# Patient Record
Sex: Female | Born: 1975 | Hispanic: Yes | Marital: Married | Smoking: Never smoker
Health system: Southern US, Community
[De-identification: ages and names within clinical notes are randomized; demographics above are authoritative.]

## PROBLEM LIST (undated history)

## (undated) DIAGNOSIS — E7800 Pure hypercholesterolemia, unspecified: Secondary | ICD-10-CM

## (undated) DIAGNOSIS — N83209 Unspecified ovarian cyst, unspecified side: Secondary | ICD-10-CM

## (undated) DIAGNOSIS — B9689 Other specified bacterial agents as the cause of diseases classified elsewhere: Secondary | ICD-10-CM

## (undated) DIAGNOSIS — N760 Acute vaginitis: Secondary | ICD-10-CM

## (undated) HISTORY — DX: Pure hypercholesterolemia, unspecified: E78.00

## (undated) HISTORY — PX: TUBAL LIGATION: SHX77

## (undated) HISTORY — DX: Unspecified ovarian cyst, unspecified side: N83.209

## (undated) HISTORY — DX: Other specified bacterial agents as the cause of diseases classified elsewhere: B96.89

## (undated) HISTORY — DX: Acute vaginitis: N76.0

---

## 2007-10-06 ENCOUNTER — Emergency Department: Payer: Self-pay | Admitting: Emergency Medicine

## 2008-12-10 ENCOUNTER — Inpatient Hospital Stay: Payer: Self-pay | Admitting: Obstetrics and Gynecology

## 2012-02-06 ENCOUNTER — Emergency Department: Payer: Self-pay | Admitting: Emergency Medicine

## 2012-02-06 LAB — URINALYSIS, COMPLETE
Bacteria: NONE SEEN
Bilirubin,UR: NEGATIVE
Glucose,UR: NEGATIVE mg/dL (ref 0–75)
Leukocyte Esterase: NEGATIVE
Ph: 6 (ref 4.5–8.0)
RBC,UR: 19 /HPF (ref 0–5)
Specific Gravity: 1.028 (ref 1.003–1.030)
Squamous Epithelial: 1
WBC UR: <1 /HPF (ref 0–5)

## 2012-02-06 LAB — PREGNANCY, URINE: Pregnancy Test, Urine: POSITIVE m[IU]/mL

## 2012-02-06 LAB — HCG, QUANTITATIVE, PREGNANCY: Beta Hcg, Quant.: 9121 m[IU]/mL — ABNORMAL HIGH

## 2012-10-02 ENCOUNTER — Inpatient Hospital Stay: Payer: Self-pay

## 2012-10-02 LAB — CBC WITH DIFFERENTIAL/PLATELET
Basophil %: 0.2 %
Eosinophil #: 0 10*3/uL (ref 0.0–0.7)
HCT: 29.6 % — ABNORMAL LOW (ref 35.0–47.0)
HGB: 9.3 g/dL — ABNORMAL LOW (ref 12.0–16.0)
Lymphocyte #: 1.9 10*3/uL (ref 1.0–3.6)
Lymphocyte %: 21.8 %
MCHC: 31.6 g/dL — ABNORMAL LOW (ref 32.0–36.0)
Monocyte %: 4 %
Neutrophil #: 6.3 10*3/uL (ref 1.4–6.5)
WBC: 8.6 10*3/uL (ref 3.6–11.0)

## 2012-10-03 LAB — HEMATOCRIT: HCT: 24.4 % — ABNORMAL LOW (ref 35.0–47.0)

## 2012-10-04 LAB — PATHOLOGY REPORT

## 2015-03-17 NOTE — Op Note (Signed)
PATIENT NAME:  Alexis Odonnell, Alexis Odonnell MR#:  [id number] DATE OF BIRTH:  [birth year]  DATE OF PROCEDURE:  10/03/2012  PREOPERATIVE DIAGNOSIS: Postpartum, desires permanent sterilization.   POSTOPERATIVE DIAGNOSIS: Postpartum, desires permanent sterilization.   PROCEDURE: Bilateral tubal ligation using Pomeroy method.   PRIMARY SURGEON: Dorthula Nettles, M.D.   ANESTHESIA:  General.  ESTIMATED BLOOD LOSS: Minimal.   OPERATIVE FLUIDS: 400 mL of crystalloid.   COMPLICATIONS: None.   SPECIMENS REMOVED: Portions of right and left tubes.   INTRAOPERATIVE FINDINGS: Normal tubes bilateral, walked out to the fimbriated ends. There was some adhesion of omentum to the prior hysterotomy incision but this was able to be navigated around without difficulty.   CONDITION FOLLOWING PROCEDURE: Stable.   PROCEDURE IN DETAIL: Risks, benefits, and alternatives of the procedure were discussed with the patient prior to proceeding. In addition we discussed the permanent nature of the procedure. The patient was taken back to the operating room where general anesthesia was administered. She was positioned in the supine position, prepped and draped in the usual sterile fashion. The umbilicus was infiltrated with 4 mL of 1% Sensorcaine. Following infiltration of the umbilicus, a vertical incision was made extending from the base of the umbilicus approximately 2 cm caudally. The subcutaneous tissue was dissected using a hemostat. The fascia was grasped with the hemostat, tented up, and grasped with a second hemostat. The first hemostat was released and regrasped. The fascia was incised using Mayo scissors. The fascial edges were then tagged with a #1 Vicryl in a GU needle. Following this, the peritoneum was entered bluntly. The patient was airplaned to her right  and the left tube was grasped with a Babcock clamp and walked out to the fimbriated end. Once the fimbria had been visualized, the tube was walked back to the mid  isthmic portion and doubly ligated using a 0 chromic wheel. The intervening knuckle of tube was then excised using Metzenbaum scissors. The tube was inspected and noted to be hemostatic. The tube was returned to the abdomen and the patient was airplaned to her left. The right tube was grasped with a Babcock clamp and walked out to the fimbriated end and then walked back to the mid isthmic portion. The tube was ligated in a similar fashion using two ties of 0 chromic on a wheel. The intervening knuckle of tube was excised using Metzenbaum scissors. The tube was inspected and noted to be hemostatic before returning to the abdomen.   Following replacement of the tube, the patient was leveled and the previously used tags on the fascia were tied together. There was a defect in the inferior portion of the fascial incision which was repaired using additional 0 chromic. Following this, the skin was closed using a 4-0 Monocryl and Dermabond was applied above the incision. An additional 10 mL of 1% Sensorcaine was injected in the subcutaneous tissue. Sponge, needle, and instrument counts were correct times two. The patient tolerated the procedure well and was taken to the recovery room in stable condition.     ____________________________ Stoney Bang. Georgianne Fick, MD ams:bjt D: 10/05/2012 12:00:15 ET T: 10/05/2012 12:26:30 ET JOB#: [id number]  cc: Stoney Bang. Georgianne Fick, MD, <Dictator> Conan Bowens Madelon Lips MD ELECTRONICALLY SIGNED 10/13/2012 23:53

## 2015-04-07 NOTE — H&P (Signed)
L&D Evaluation:  History Expanded:   HPI 39 yo [place], presents in active labor with EDD of 10/07/12. Denies SROM or VB. +FM. Pt was 4 cm on admission about 2 hours ago. PNC notable for early entry to care. H/o prior CS x 1 with 3 VBACs    Group B Strep Results Maternal (Result >5wks must be treated as unknown) negative    Maternal HIV Negative    Maternal Syphilis Ab Nonreactive    Patient's Medical History No Chronic Illness    Patient's Surgical History Previous C-Section    Medications Pre Natal Vitamins    Allergies NKDA   ROS:   ROS see HPI   Exam:   Vital Signs stable    General no apparent distress    Mental Status clear    Chest clear    Heart no murmur/gallop/rubs    Abdomen gravid, tender with contractions    Estimated Fetal Weight Average for gestational age    Pelvic no external lesions, 6/80/-2    Mebranes Intact    FHT normal rate with no decels    Ucx regular, q 4-6 min, moderate   Impression:   Impression active labor   Plan:   Comments Admission for delivery Anticipate vaginal delivery   Electronic Signatures: Ander Purpura (CNM)  (Signed 05-Nov-13 11:47)  Authored: L&D Evaluation   Last Updated: 05-Nov-13 11:47 by Ander Purpura (CNM)

## 2017-05-04 ENCOUNTER — Encounter: Payer: Self-pay | Admitting: Advanced Practice Midwife

## 2017-05-05 ENCOUNTER — Ambulatory Visit (INDEPENDENT_AMBULATORY_CARE_PROVIDER_SITE_OTHER): Payer: Self-pay | Admitting: Advanced Practice Midwife

## 2017-05-05 ENCOUNTER — Encounter: Payer: Self-pay | Admitting: Advanced Practice Midwife

## 2017-05-05 VITALS — BP 120/80 | HR 68 | Ht 61.0 in | Wt 131.0 lb

## 2017-05-05 DIAGNOSIS — Z113 Encounter for screening for infections with a predominantly sexual mode of transmission: Secondary | ICD-10-CM

## 2017-05-05 DIAGNOSIS — N760 Acute vaginitis: Secondary | ICD-10-CM

## 2017-05-05 DIAGNOSIS — Z01419 Encounter for gynecological examination (general) (routine) without abnormal findings: Secondary | ICD-10-CM

## 2017-05-05 DIAGNOSIS — Z124 Encounter for screening for malignant neoplasm of cervix: Secondary | ICD-10-CM

## 2017-05-05 MED ORDER — FLUCONAZOLE 150 MG PO TABS: 150.0000 mg | ORAL_TABLET | Freq: Once | ORAL | 1 refills | Status: DC

## 2017-05-05 NOTE — Progress Notes (Signed)
Patient ID: Alexis Odonnell, female   DOB: [birth year], 41 y.o.   MRN: [id number]     Gynecology Annual Exam  PCP: Patient, No Pcp Per  Chief Complaint:  Chief Complaint  Patient presents with  . Gynecologic Exam    History of Present Illness: Patient is a 41 y.o. [id number] presents for annual exam. The patient has complaints today of intermittent yeast infection for the past six months. She has been having itching and burning with thick white discharge.  She also has concerns for STIs due to her husband having an extramarital affair in the last year.  LMP: Patient's last menstrual period was 04/11/2017. Average Interval: regular, 28 days Duration of flow: 4 days Heavy Menses: no Clots: no Intermenstrual Bleeding: no Postcoital Bleeding: no Dysmenorrhea: no   The patient is sexually active. She currently uses bilateral tubal ligation for contraception. She denies dyspareunia.  The patient does perform self breast exams.  There is no notable family history of breast or ovarian cancer in her family.  The patient wears seatbelts: yes.   The patient has regular exercise: yes.    The patient denies current symptoms of depression.    Review of Systems: Review of Systems  Constitutional: Negative.   HENT: Negative.   Eyes: Negative.   Respiratory: Negative.   Cardiovascular: Negative.   Gastrointestinal: Negative.   Genitourinary: Negative.   Musculoskeletal: Negative.   Skin: Negative.   Neurological: Negative.   Endo/Heme/Allergies: Negative.   Psychiatric/Behavioral: Negative.     Past Medical History:  Past Medical History:  Diagnosis Date  . Ovarian cyst     Past Surgical History:  Past Surgical History:  Procedure Laterality Date  . CESAREAN SECTION    . TUBAL LIGATION    . TUBAL LIGATION      Gynecologic History:  Patient's last menstrual period was 04/11/2017. Contraception: bilateral tubal ligation Last Pap: 2012 Results were: normal Last mammogram:  many years ago Results were: normal Obstetric History: [id number]  Family History:  Family History  Problem Relation Age of Onset  . Hypertension Mother   . Diabetes Father   . Hyperlipidemia Brother     Social History:  Social History   Social History  . Marital status: Single    Spouse name: N/A  . Number of children: N/A  . Years of education: N/A   Occupational History  . Not on file.   Social History Main Topics  . Smoking status: Never Smoker  . Smokeless tobacco: Never Used  . Alcohol use No  . Drug use: No  . Sexual activity: Yes    Birth control/ protection: Surgical   Other Topics Concern  . Not on file   Social History Narrative  . No narrative on file    Allergies:  No Known Allergies  Medications: Prior to Admission medications   Not on File    Physical Exam Vitals: Blood pressure 120/80, pulse 68, height 5\' 1"  (1.549 m), weight 131 lb (59.4 kg), last menstrual period 04/11/2017.  General: NAD HEENT: normocephalic, anicteric Thyroid: no enlargement, no palpable nodules Pulmonary: No increased work of breathing, CTAB Cardiovascular: RRR, distal pulses 2+ Breast: Breast symmetrical, no tenderness, no palpable nodules or masses, no skin or nipple retraction present, no nipple discharge.  No axillary or supraclavicular lymphadenopathy. Abdomen: NABS, soft, non-tender, non-distended.  Umbilicus without lesions.  No hepatomegaly, splenomegaly or masses palpable. No evidence of hernia  Genitourinary:  External: Normal external female genitalia.  Normal urethral meatus, normal Bartholin's  and Skene's glands.    Vagina: Normal vaginal mucosa, no evidence of prolapse.    Cervix: Grossly normal in appearance, no bleeding, no CMT  Uterus: Non-enlarged, mobile, normal contour.    Adnexa: ovaries non-enlarged, no adnexal masses  Rectal: deferred  Lymphatic: no evidence of inguinal lymphadenopathy Extremities: no edema, erythema, or tenderness Neurologic:  Grossly intact Psychiatric: mood appropriate, affect full    Assessment: 41 y.o. [id number] Well woman exam with PAP and Nuswab.   Plan: Problem List Items Addressed This Visit    None    Visit Diagnoses    Well woman exam with routine gynecological exam    -  Primary   Relevant Orders   NuSwab Vaginitis Plus (VG+)   Pap IG (Image Guided)   Cervical cancer screening       Relevant Orders   Pap IG (Image Guided)   Screen for sexually transmitted diseases       Relevant Orders   NuSwab Vaginitis Plus (VG+)   Vaginitis and vulvovaginitis       Relevant Orders   NuSwab Vaginitis Plus (VG+)      1) Mammogram - recommend yearly screening mammogram.  Mammogram patient to schedule   2) STI screening was offered and accepted  3) ASCCP guidelines and rational discussed.  Patient opts for every 3 year screening interval  4) Continue healthy lifestyle diet and exercise  5) Routine healthcare maintenance including cholesterol, diabetes screening discussed: declines  6) Return to clinic in 1 year for annual   Rod Can, [place]

## 2017-05-09 LAB — PAP IG (IMAGE GUIDED): PAP Smear Comment: 0

## 2017-05-10 LAB — NUSWAB VAGINITIS PLUS (VG+)
Atopobium vaginae: HIGH Score — AB
Candida albicans, NAA: POSITIVE — AB
Candida glabrata, NAA: NEGATIVE
Chlamydia trachomatis, NAA: NEGATIVE
Neisseria gonorrhoeae, NAA: NEGATIVE
Trich vag by NAA: NEGATIVE

## 2017-05-16 MED ORDER — FLUCONAZOLE 150 MG PO TABS: 150.0000 mg | ORAL_TABLET | Freq: Once | ORAL | 1 refills | Status: DC

## 2017-05-16 NOTE — Addendum Note (Signed)
Addended by: Rod Can on: 05/16/2017 06:17 PM   Modules accepted: Orders

## 2018-10-12 ENCOUNTER — Ambulatory Visit (INDEPENDENT_AMBULATORY_CARE_PROVIDER_SITE_OTHER): Payer: Self-pay | Admitting: Advanced Practice Midwife

## 2018-10-12 ENCOUNTER — Other Ambulatory Visit (HOSPITAL_COMMUNITY)
Admission: RE | Admit: 2018-10-12 | Discharge: 2018-10-12 | Disposition: A | Discharge status: Home / Self Care | Payer: Self-pay | Source: Ambulatory Visit | Attending: Advanced Practice Midwife | Admitting: Advanced Practice Midwife

## 2018-10-12 ENCOUNTER — Encounter: Payer: Self-pay | Admitting: Advanced Practice Midwife

## 2018-10-12 VITALS — BP 118/74 | Wt 134.0 lb

## 2018-10-12 DIAGNOSIS — N898 Other specified noninflammatory disorders of vagina: Secondary | ICD-10-CM

## 2018-10-12 NOTE — Progress Notes (Signed)
   Patient ID: Alexis Odonnell, female   DOB: [birth year], 42 y.o.   MRN: [id number]  Reason for Consult: Vaginitis    Subjective:     HPI:  Alexis Odonnell is a 42 y.o. female has complaint of vaginal itching since her period stopped. She noticed the itching when she was wearing pads. She denies discharge, odor or irritation. She is concerned that she may have a yeast infection. She has no other concerns today. Recommended cotton covered pads to avoid moisture.  Past Medical History:  Diagnosis Date  . Ovarian cyst    Family History  Problem Relation Age of Onset  . Hypertension Mother   . Diabetes Father   . Hyperlipidemia Brother    Past Surgical History:  Procedure Laterality Date  . CESAREAN SECTION    . TUBAL LIGATION    . TUBAL LIGATION      Short Social History:  Social History   Tobacco Use  . Smoking status: Never Smoker  . Smokeless tobacco: Never Used  Substance Use Topics  . Alcohol use: No    No Known Allergies  No current outpatient medications on file.   No current facility-administered medications for this visit.     Review of Systems  Constitutional:  Constitutional negative. HENT: HENT negative.  Eyes: Eyes negative.  Respiratory: Respiratory negative.  Cardiovascular: Cardiovascular negative.  GI: Gastrointestinal negative.  GU:       Vaginal itching  Musculoskeletal: Musculoskeletal negative.  Skin: Skin negative.  Neurological: Neurological negative. Hematologic: Hematologic/lymphatic negative.  Psychiatric: Psychiatric negative.        Objective:  Objective   Vitals:   10/12/18 1504  BP: 118/74  Weight: 134 lb (60.8 kg)   Body mass index is 25.32 kg/m.  Vital Signs: BP 118/74   Wt 134 lb (60.8 kg)   LMP 10/01/2018   BMI 25.32 kg/m  Constitutional: Well nourished, well developed female in no acute distress.  HEENT: normal Skin: Warm and dry.    Respiratory: Normal respiratory effort Psych: Alert and Oriented x3. No  memory deficits. Normal mood and affect.    Pelvic exam:  is not limited by body habitus EGBUS: within normal limits Vagina: within normal limits and with normal mucosa blood, scant discharge Cervix: not evaluated  Data: Wet prep negative for clue cells, whiff, yeast NuSwab pending     Assessment/Plan:     42 yo G6 P5015  Will await results of NuSwab prior to giving medication Vaginitis treatments discussed Use non-allergenic body products, cotton underwear, loose fitting clothing   Rod Can CNM  [facility], [place]

## 2018-10-16 LAB — CERVICOVAGINAL ANCILLARY ONLY
Bacterial vaginitis: POSITIVE — AB
Candida vaginitis: NEGATIVE
Trichomonas: NEGATIVE

## 2018-10-17 ENCOUNTER — Other Ambulatory Visit: Payer: Self-pay | Admitting: Advanced Practice Midwife

## 2018-10-17 DIAGNOSIS — B9689 Other specified bacterial agents as the cause of diseases classified elsewhere: Secondary | ICD-10-CM

## 2018-10-17 DIAGNOSIS — B373 Candidiasis of vulva and vagina: Secondary | ICD-10-CM

## 2018-10-17 DIAGNOSIS — B3731 Acute candidiasis of vulva and vagina: Secondary | ICD-10-CM

## 2018-10-17 DIAGNOSIS — N760 Acute vaginitis: Principal | ICD-10-CM

## 2018-10-17 MED ORDER — FLUCONAZOLE 150 MG PO TABS: 150.0000 mg | ORAL_TABLET | Freq: Once | ORAL | 1 refills | Status: AC

## 2018-10-17 MED ORDER — METRONIDAZOLE 500 MG PO TABS: 500.0000 mg | ORAL_TABLET | Freq: Two times a day (BID) | ORAL | 0 refills | Status: AC

## 2018-10-17 NOTE — Progress Notes (Signed)
Rx Metronidazole sent to treat BV. Also sent Rx Diflucan in case yeast develops.

## 2019-03-27 ENCOUNTER — Other Ambulatory Visit: Payer: Self-pay | Admitting: Obstetrics and Gynecology

## 2019-03-27 MED ORDER — TERCONAZOLE 0.8 % VA CREA: 1.0000 | TOPICAL_CREAM | Freq: Every day | VAGINAL | 0 refills | Status: AC

## 2019-05-24 ENCOUNTER — Other Ambulatory Visit: Payer: Self-pay

## 2019-05-24 ENCOUNTER — Other Ambulatory Visit: Payer: Self-pay | Admitting: Internal Medicine

## 2019-05-24 DIAGNOSIS — Z20822 Contact with and (suspected) exposure to covid-19: Secondary | ICD-10-CM

## 2019-05-30 LAB — NOVEL CORONAVIRUS, NAA: SARS-CoV-2, NAA: DETECTED — AB

## 2019-07-01 ENCOUNTER — Other Ambulatory Visit: Payer: Self-pay

## 2019-07-01 ENCOUNTER — Encounter: Payer: Self-pay | Admitting: Advanced Practice Midwife

## 2019-07-01 ENCOUNTER — Ambulatory Visit (INDEPENDENT_AMBULATORY_CARE_PROVIDER_SITE_OTHER): Payer: No Typology Code available for payment source | Admitting: Advanced Practice Midwife

## 2019-07-01 VITALS — BP 126/84 | Wt 132.0 lb

## 2019-07-01 DIAGNOSIS — B379 Candidiasis, unspecified: Secondary | ICD-10-CM | POA: Diagnosis not present

## 2019-07-01 DIAGNOSIS — N760 Acute vaginitis: Secondary | ICD-10-CM | POA: Diagnosis not present

## 2019-07-01 DIAGNOSIS — B9689 Other specified bacterial agents as the cause of diseases classified elsewhere: Secondary | ICD-10-CM | POA: Diagnosis not present

## 2019-07-01 MED ORDER — METRONIDAZOLE 500 MG PO TABS
500.0000 mg | ORAL_TABLET | Freq: Two times a day (BID) | ORAL | 0 refills | Status: AC
Start: 2019-07-01 — End: 2019-07-08

## 2019-07-01 MED ORDER — FLUCONAZOLE 150 MG PO TABS: 150.0000 mg | ORAL_TABLET | Freq: Once | ORAL | 1 refills | Status: AC

## 2019-07-01 NOTE — Progress Notes (Signed)
   Patient ID: Alexis Odonnell, female   DOB: 11/21/1976, 43 y.o.   MRN: [id number]  Reason for Consult: Vaginitis    Subjective:     HPI:  Alexis Odonnell is a 43 y.o. female being seen for symptoms of vaginitis. For the past 3 weeks she has had burning and itching. Two days ago she also noticed an odor. She denies discharge. A prescription antifungal was sent for her 2 months ago which she says did not help.   She is also wondering when her next PAP smear is due. She is due June 2021. She has no other concerns today.  Past Medical History:  Diagnosis Date  . Ovarian cyst    Family History  Problem Relation Age of Onset  . Hypertension Mother   . Diabetes Father   . Hyperlipidemia Brother    Past Surgical History:  Procedure Laterality Date  . CESAREAN SECTION    . TUBAL LIGATION    . TUBAL LIGATION      Short Social History:  Social History   Tobacco Use  . Smoking status: Never Smoker  . Smokeless tobacco: Never Used  Substance Use Topics  . Alcohol use: No    No Known Allergies  Current Outpatient Medications  Medication Sig Dispense Refill  . fluconazole (DIFLUCAN) 150 MG tablet Take 1 tablet (150 mg total) by mouth once for 1 dose. Can take additional dose three days later if symptoms persist 1 tablet 1  . metroNIDAZOLE (FLAGYL) 500 MG tablet Take 1 tablet (500 mg total) by mouth 2 (two) times daily for 7 days. 14 tablet 0   No current facility-administered medications for this visit.     Review of Systems  Constitutional: Negative.   HENT: Negative.   Eyes: Negative.   Respiratory: Negative.   Cardiovascular: Negative.   Gastrointestinal: Negative.   Genitourinary:       Vaginal burning, itching and odor  Musculoskeletal: Negative.   Skin: Negative.   Neurological: Negative.   Endo/Heme/Allergies: Negative.   Psychiatric/Behavioral: Negative.         Objective:  Objective   Vitals:   07/01/19 1630  BP: 126/84  Weight: 132 lb (59.9 kg)    Body mass index is 24.94 kg/m. Constitutional: Well nourished, well developed female in no acute distress.  HEENT: normal Skin: Warm and dry.  Cardiovascular: Regular rate and rhythm.   Respiratory:  Normal respiratory effort Neuro: DTRs 2+, Cranial nerves grossly intact Psych: Alert and Oriented x3. No memory deficits. Normal mood and affect.  MS: normal gait, normal bilateral lower extremity ROM/strength/stability.  Pelvic exam:  is not limited by body habitus EGBUS: within normal limits Vagina: within normal limits and with normal mucosa, scant normal appearing discharge Cervix: normal appearance   Data: Wet Prep: Ph: 7 Whiff: mild Clue cells present Yeast: negative     Assessment/Plan:     43 yo G6 P9 female with bacterial vaginosis  Rx Metronidazole Rx Diflucan if yeast develops Return to clinic as needed and for annual exam June 2021   [facility] 07/01/2019, 5:07 PM

## 2019-08-13 ENCOUNTER — Telehealth: Payer: Self-pay

## 2019-08-13 NOTE — Telephone Encounter (Signed)
Pt wants to know if she can have the gel for the BV instead of the pills. States she still has all the same symptoms

## 2019-08-14 ENCOUNTER — Other Ambulatory Visit: Payer: Self-pay | Admitting: Advanced Practice Midwife

## 2019-08-14 DIAGNOSIS — B9689 Other specified bacterial agents as the cause of diseases classified elsewhere: Secondary | ICD-10-CM

## 2019-08-14 MED ORDER — METRONIDAZOLE 0.75 % VA GEL: 1.0000 | Freq: Every day | VAGINAL | 1 refills | Status: AC

## 2019-08-14 NOTE — Telephone Encounter (Signed)
Please let her know that I have sent the Rx for the gel.  Thanks

## 2019-08-14 NOTE — Progress Notes (Unsigned)
Rx metrogel sent for symptoms of BV.

## 2019-08-15 NOTE — Telephone Encounter (Signed)
Pt aware.

## 2019-10-30 ENCOUNTER — Encounter: Payer: Self-pay | Admitting: Obstetrics and Gynecology

## 2019-10-30 ENCOUNTER — Other Ambulatory Visit: Payer: Self-pay

## 2019-10-30 ENCOUNTER — Ambulatory Visit (INDEPENDENT_AMBULATORY_CARE_PROVIDER_SITE_OTHER): Payer: No Typology Code available for payment source | Admitting: Obstetrics and Gynecology

## 2019-10-30 VITALS — BP 106/80 | Ht 61.0 in | Wt 137.0 lb

## 2019-10-30 DIAGNOSIS — N760 Acute vaginitis: Secondary | ICD-10-CM

## 2019-10-30 DIAGNOSIS — Z1231 Encounter for screening mammogram for malignant neoplasm of breast: Secondary | ICD-10-CM

## 2019-10-30 DIAGNOSIS — B9689 Other specified bacterial agents as the cause of diseases classified elsewhere: Secondary | ICD-10-CM | POA: Diagnosis not present

## 2019-10-30 LAB — POCT WET PREP WITH KOH
Clue Cells Wet Prep HPF POC: POSITIVE
KOH Prep POC: POSITIVE — AB
Trichomonas, UA: NEGATIVE
Yeast Wet Prep HPF POC: NEGATIVE

## 2019-10-30 MED ORDER — CLINDAMYCIN HCL 300 MG PO CAPS: 300.0000 mg | ORAL_CAPSULE | Freq: Two times a day (BID) | ORAL | 0 refills | Status: AC

## 2019-10-30 NOTE — Patient Instructions (Signed)
I value your feedback and entrusting us with your care. If you get a [facility] patient survey, I would appreciate you taking the time to let us know about your experience today. Thank you! 

## 2019-10-30 NOTE — Progress Notes (Signed)
Patient, No Pcp Per   Chief Complaint  Patient presents with  . Vaginal Discharge    fishy odor, no itchiness or irritation x 2 weeks    HPI:      Ms. Kaylaann Viergutz is a 43 y.o. [id number] who LMP was Patient's last menstrual period was 10/04/2019 (exact date)., presents today for increased d/c with fishy odor, no irritation, for the past 2 wks. Had BV 8/20 and 9/20, treated with flagyl and metrogel with sx relief. Sx have recurred. No urin sx, LBP, new pelvic pain (gets occas RLQ pain but does heavy lifting), fevers. She is sex active, no new partners. No meds to treat.  Not taking probiotics.   There are no active problems to display for this patient.   Past Surgical History:  Procedure Laterality Date  . CESAREAN SECTION    . TUBAL LIGATION    . TUBAL LIGATION      Family History  Problem Relation Age of Onset  . Hypertension Mother   . Diabetes Father   . Hyperlipidemia Brother     Social History   Socioeconomic History  . Marital status: Single    Spouse name: Not on file  . Number of children: Not on file  . Years of education: Not on file  . Highest education level: Not on file  Occupational History  . Not on file  Social Needs  . Financial resource strain: Not on file  . Food insecurity    Worry: Not on file    Inability: Not on file  . Transportation needs    Medical: Not on file    Non-medical: Not on file  Tobacco Use  . Smoking status: Never Smoker  . Smokeless tobacco: Never Used  Substance and Sexual Activity  . Alcohol use: No  . Drug use: No  . Sexual activity: Yes    Birth control/protection: Surgical  Lifestyle  . Physical activity    Days per week: Not on file    Minutes per session: Not on file  . Stress: Not on file  Relationships  . Social Herbalist on phone: Not on file    Gets together: Not on file    Attends religious service: Not on file    Active member of club or organization: Not on file    Attends meetings  of clubs or organizations: Not on file    Relationship status: Not on file  . Intimate partner violence    Fear of current or ex partner: Not on file    Emotionally abused: Not on file    Physically abused: Not on file    Forced sexual activity: Not on file  Other Topics Concern  . Not on file  Social History Narrative  . Not on file    No outpatient medications prior to visit.   No facility-administered medications prior to visit.      ROS:  Review of Systems  Constitutional: Negative for fever.  Gastrointestinal: Negative for blood in stool, constipation, diarrhea, nausea and vomiting.  Genitourinary: Positive for vaginal discharge. Negative for dyspareunia, dysuria, flank pain, frequency, hematuria, urgency, vaginal bleeding and vaginal pain.  Musculoskeletal: Negative for back pain.  Skin: Negative for rash.   BREAST: No symptoms   OBJECTIVE:   Vitals:  BP 106/80   Ht 5\' 1"  (1.549 m)   Wt 137 lb (62.1 kg)   LMP 10/04/2019 (Exact Date)   BMI 25.89 kg/m   Physical Exam  Vitals signs reviewed.  Constitutional:      Appearance: She is well-developed.  Neck:     Musculoskeletal: Normal range of motion.  Pulmonary:     Effort: Pulmonary effort is normal.  Genitourinary:    General: Normal vulva.     Pubic Area: No rash.      Labia:        Right: No rash, tenderness or lesion.        Left: No rash, tenderness or lesion.      Vagina: Vaginal discharge present. No erythema or tenderness.     Cervix: Normal.     Uterus: Normal. Not enlarged and not tender.      Adnexa: Right adnexa normal and left adnexa normal.       Right: No mass or tenderness.         Left: No mass or tenderness.    Musculoskeletal: Normal range of motion.  Skin:    General: Skin is warm and dry.  Neurological:     General: No focal deficit present.     Mental Status: She is alert and oriented to person, place, and time.  Psychiatric:        Mood and Affect: Mood normal.         Behavior: Behavior normal.        Thought Content: Thought content normal.        Judgment: Judgment normal.     Results: Results for orders placed or performed in visit on 10/30/19 (from the past 24 hour(s))  POCT Wet Prep with KOH     Status: Abnormal   Collection Time: 10/30/19  5:01 PM  Result Value Ref Range   Trichomonas, UA Negative    Clue Cells Wet Prep HPF POC pos    Epithelial Wet Prep HPF POC     Yeast Wet Prep HPF POC neg    Bacteria Wet Prep HPF POC     RBC Wet Prep HPF POC     WBC Wet Prep HPF POC     KOH Prep POC Positive (A) Negative     Assessment/Plan: Bacterial vaginosis - Plan: clindamycin (CLEOCIN) 300 MG capsule, POCT Wet Prep with KOH; pos sx/wet prep. Rx clindamycin since recurrent. Add probiotics. F/u prn.   Encounter for screening mammogram for malignant neoplasm of breast - Plan: 3D MAMMOGRAM SCREENING BILATERAL; due for mammo, pt to sched.   Annual past due and pt to sched.   Meds ordered this encounter  Medications  . clindamycin (CLEOCIN) 300 MG capsule    Sig: Take 1 capsule (300 mg total) by mouth 2 (two) times daily for 7 days.    Dispense:  14 capsule    Refill:  0    Order Specific Question:   Supervising Provider    Answer:   Gae Dry [id number]      Return in about 1 month (around 11/30/2019), or if symptoms worsen or fail to improve, for annual.  Semya Klinke B. Keldric Poyer, PA-C 10/30/2019 5:02 PM

## 2019-11-07 ENCOUNTER — Ambulatory Visit
Admission: RE | Admit: 2019-11-07 | Discharge: 2019-11-07 | Disposition: A | Discharge status: Home / Self Care | Payer: No Typology Code available for payment source | Source: Ambulatory Visit | Attending: Obstetrics and Gynecology | Admitting: Obstetrics and Gynecology

## 2019-11-07 DIAGNOSIS — Z1231 Encounter for screening mammogram for malignant neoplasm of breast: Secondary | ICD-10-CM | POA: Insufficient documentation

## 2019-11-12 ENCOUNTER — Other Ambulatory Visit: Payer: Self-pay | Admitting: Obstetrics and Gynecology

## 2019-11-12 DIAGNOSIS — R928 Other abnormal and inconclusive findings on diagnostic imaging of breast: Secondary | ICD-10-CM

## 2019-11-12 DIAGNOSIS — N6320 Unspecified lump in the left breast, unspecified quadrant: Secondary | ICD-10-CM

## 2019-11-12 DIAGNOSIS — N6489 Other specified disorders of breast: Secondary | ICD-10-CM

## 2019-11-26 ENCOUNTER — Ambulatory Visit
Admission: RE | Admit: 2019-11-26 | Discharge: 2019-11-26 | Disposition: A | Discharge status: Home / Self Care | Payer: No Typology Code available for payment source | Source: Ambulatory Visit | Attending: Obstetrics and Gynecology | Admitting: Obstetrics and Gynecology

## 2019-11-26 DIAGNOSIS — N6320 Unspecified lump in the left breast, unspecified quadrant: Secondary | ICD-10-CM | POA: Diagnosis present

## 2019-11-26 DIAGNOSIS — R928 Other abnormal and inconclusive findings on diagnostic imaging of breast: Secondary | ICD-10-CM

## 2019-11-26 DIAGNOSIS — N6489 Other specified disorders of breast: Secondary | ICD-10-CM | POA: Diagnosis present

## 2019-11-27 ENCOUNTER — Encounter: Payer: Self-pay | Admitting: Obstetrics and Gynecology

## 2019-12-05 ENCOUNTER — Other Ambulatory Visit: Payer: Self-pay

## 2019-12-05 ENCOUNTER — Encounter: Payer: Self-pay | Admitting: Obstetrics and Gynecology

## 2019-12-05 ENCOUNTER — Ambulatory Visit (INDEPENDENT_AMBULATORY_CARE_PROVIDER_SITE_OTHER): Payer: No Typology Code available for payment source | Admitting: Obstetrics and Gynecology

## 2019-12-05 VITALS — BP 110/58 | Ht 61.0 in | Wt 129.0 lb

## 2019-12-05 DIAGNOSIS — Z01419 Encounter for gynecological examination (general) (routine) without abnormal findings: Secondary | ICD-10-CM | POA: Diagnosis not present

## 2019-12-05 DIAGNOSIS — N926 Irregular menstruation, unspecified: Secondary | ICD-10-CM

## 2019-12-05 DIAGNOSIS — Z1231 Encounter for screening mammogram for malignant neoplasm of breast: Secondary | ICD-10-CM

## 2019-12-05 DIAGNOSIS — K644 Residual hemorrhoidal skin tags: Secondary | ICD-10-CM | POA: Insufficient documentation

## 2019-12-05 NOTE — Patient Instructions (Signed)
I value your feedback and entrusting us with your care. If you get a [facility] patient survey, I would appreciate you taking the time to let us know about your experience today. Thank you!  As of November 07, 2019, your lab results will be released to your MyChart immediately, before I even have a chance to see them. Please give me time to review them and contact you if there are any abnormalities. Thank you for your patience.  

## 2019-12-05 NOTE — Progress Notes (Signed)
PCP:  Patient, No Pcp Per   Chief Complaint  Patient presents with  . Gynecologic Exam    Missed period     HPI:      Ms. Alexis Odonnell is a 44 y.o. [id number] who LMP was Patient's last menstrual period was 11/02/2019 (exact date)., presents today for her annual examination.  Her menses are regular every 28-30 days, lasting 3 days.  Dysmenorrhea mild, occurring first 1-2 days of flow. She does not have intermenstrual bleeding. Late menses this month. No DUB sx. Not concerned about being pregnant.   Treated for BV 12/20 with clindamycin with sx relief. 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 Not taking probiotics.   Sex activity: single partner, contraception - tubal ligation.  Last Pap: May 05, 2017  Results were: no abnormalities /no HPV DNA done Hx of STDs: none  Has noticed a bulge in her anus, worse after lifting. Can sometimes push it back in. No pain/bleeding.   Last mammogram: November 26, 2019  Results were: normal after addl views--routine follow-up in 12 months There is no FH of breast cancer. There is no FH of ovarian cancer. The patient does do self-breast exams.  Tobacco use: The patient denies current or previous tobacco use. Alcohol use: none No drug use.  Exercise: not active  She does not get adequate calcium and Vitamin D in her diet.   Past Medical History:  Diagnosis Date  . BV (bacterial vaginosis)   . Ovarian cyst      Past Surgical History:  Procedure Laterality Date  . CESAREAN SECTION    . TUBAL LIGATION    . TUBAL LIGATION      Family History  Problem Relation Age of Onset  . Hypertension Mother   . Diabetes Father   . Hyperlipidemia Brother   . Breast cancer Neg Hx   . Ovarian cancer Neg Hx     Social History   Socioeconomic History  . Marital status: Single    Spouse name: Not on file  . Number of children: Not on file  . Years of education: Not on file  . Highest education level: Not on file  Occupational History  . Not on file   Tobacco Use  . Smoking status: Never Smoker  . Smokeless tobacco: Never Used  Substance and Sexual Activity  . Alcohol use: No  . Drug use: No  . Sexual activity: Yes    Birth control/protection: Surgical  Other Topics Concern  . Not on file  Social History Narrative  . Not on file   Social Determinants of Health   Financial Resource Strain:   . Difficulty of Paying Living Expenses: Not on file  Food Insecurity:   . Worried About Charity fundraiser in the Last Year: Not on file  . Ran Out of Food in the Last Year: Not on file  Transportation Needs:   . Lack of Transportation (Medical): Not on file  . Lack of Transportation (Non-Medical): Not on file  Physical Activity:   . Days of Exercise per Week: Not on file  . Minutes of Exercise per Session: Not on file  Stress:   . Feeling of Stress : Not on file  Social Connections:   . Frequency of Communication with Friends and Family: Not on file  . Frequency of Social Gatherings with Friends and Family: Not on file  . Attends Religious Services: Not on file  . Active Member of Clubs or Organizations: Not on file  .  Attends Archivist Meetings: Not on file  . Marital Status: Not on file  Intimate Partner Violence:   . Fear of Current or Ex-Partner: Not on file  . Emotionally Abused: Not on file  . Physically Abused: Not on file  . Sexually Abused: Not on file     Current Outpatient Medications:  .  Ferrous Sulfate (IRON) 28 MG TABS, Take by mouth., Disp: , Rfl:  .  Vitamin D, Ergocalciferol, (DRISDOL) 1.25 MG (50000 UT) CAPS capsule, Take 50,000 Units by mouth once a week., Disp: , Rfl:      ROS:  Review of Systems  Constitutional: Negative for fatigue, fever and unexpected weight change.  Respiratory: Negative for cough, shortness of breath and wheezing.   Cardiovascular: Negative for chest pain, palpitations and leg swelling.  Gastrointestinal: Negative for blood in stool, constipation, diarrhea, nausea  and vomiting.  Endocrine: Negative for cold intolerance, heat intolerance and polyuria.  Genitourinary: Positive for menstrual problem. Negative for dyspareunia, dysuria, flank pain, frequency, genital sores, hematuria, pelvic pain, urgency, vaginal bleeding, vaginal discharge and vaginal pain.  Musculoskeletal: Negative for back pain, joint swelling and myalgias.  Skin: Negative for rash.  Neurological: Negative for dizziness, syncope, light-headedness, numbness and headaches.  Hematological: Negative for adenopathy.  Psychiatric/Behavioral: Negative for agitation, confusion, sleep disturbance and suicidal ideas. The patient is not nervous/anxious.   BREAST: No symptoms   Objective: BP (!) 110/58   Ht 5\' 1"  (1.549 m)   Wt 129 lb (58.5 kg)   LMP 11/02/2019 (Exact Date)   BMI 24.37 kg/m    Physical Exam Constitutional:      Appearance: She is well-developed.  Genitourinary:     Vulva, cervix, uterus, right adnexa and left adnexa normal.     No vulval lesion or tenderness noted.     Vaginal bleeding present.     No vaginal discharge, erythema or tenderness.     No cervical polyp.     Uterus is not enlarged or tender.     No right or left adnexal mass present.     Right adnexa not tender.     Left adnexa not tender.     Genitourinary Comments: PALE PINK D/C VAGINALLY, C/W EARLY MENSES  Rectum:     External hemorrhoid present.  Neck:     Thyroid: No thyromegaly.  Cardiovascular:     Rate and Rhythm: Normal rate and regular rhythm.     Heart sounds: Normal heart sounds. No murmur.  Pulmonary:     Effort: Pulmonary effort is normal.     Breath sounds: Normal breath sounds.  Chest:     Breasts:        Right: No mass, nipple discharge, skin change or tenderness.        Left: No mass, nipple discharge, skin change or tenderness.  Abdominal:     Palpations: Abdomen is soft.     Tenderness: There is no abdominal tenderness. There is no guarding.  Musculoskeletal:         General: Normal range of motion.     Cervical back: Normal range of motion.  Neurological:     General: No focal deficit present.     Mental Status: She is alert and oriented to person, place, and time.     Cranial Nerves: No cranial nerve deficit.  Skin:    General: Skin is warm and dry.  Psychiatric:        Mood and Affect: Mood normal.  Behavior: Behavior normal.        Thought Content: Thought content normal.        Judgment: Judgment normal.  Vitals reviewed.    Assessment/Plan: Encounter for annual routine gynecological examination  Encounter for screening mammogram for malignant neoplasm of breast; pt current on mammo  Late menses--looks like going to start today due to pale pink d/c on exam  External hemorrhoid--no evidence thrombosis, no sx. Reassurance. F/u prn.           GYN counsel mammography screening, adequate intake of calcium and vitamin D, diet and exercise     F/U  Return in about 1 year (around 12/04/2020).  Alicia B. Copland, PA-C 12/05/2019 4:34 PM

## 2020-02-10 ENCOUNTER — Encounter: Payer: Self-pay | Admitting: Obstetrics and Gynecology

## 2020-02-10 ENCOUNTER — Other Ambulatory Visit: Payer: Self-pay | Admitting: Obstetrics and Gynecology

## 2020-02-10 MED ORDER — FLUCONAZOLE 150 MG PO TABS: 150.0000 mg | ORAL_TABLET | Freq: Once | ORAL | 0 refills | Status: AC

## 2020-02-10 NOTE — Progress Notes (Signed)
Rx diflucan for yeast vag sx.  

## 2020-07-10 ENCOUNTER — Other Ambulatory Visit: Payer: Self-pay

## 2020-07-10 ENCOUNTER — Emergency Department: Payer: No Typology Code available for payment source

## 2020-07-10 ENCOUNTER — Emergency Department
Admission: EM | Admit: 2020-07-10 | Discharge: 2020-07-11 | Disposition: A | Discharge status: Home / Self Care | Payer: No Typology Code available for payment source | Attending: Emergency Medicine | Admitting: Emergency Medicine

## 2020-07-10 ENCOUNTER — Encounter: Payer: Self-pay | Admitting: Physician Assistant

## 2020-07-10 DIAGNOSIS — S6392XA Sprain of unspecified part of left wrist and hand, initial encounter: Secondary | ICD-10-CM | POA: Insufficient documentation

## 2020-07-10 DIAGNOSIS — Y9389 Activity, other specified: Secondary | ICD-10-CM | POA: Diagnosis not present

## 2020-07-10 DIAGNOSIS — Y92410 Unspecified street and highway as the place of occurrence of the external cause: Secondary | ICD-10-CM | POA: Diagnosis not present

## 2020-07-10 DIAGNOSIS — S66812A Strain of other specified muscles, fascia and tendons at wrist and hand level, left hand, initial encounter: Secondary | ICD-10-CM | POA: Diagnosis not present

## 2020-07-10 DIAGNOSIS — Z041 Encounter for examination and observation following transport accident: Secondary | ICD-10-CM | POA: Insufficient documentation

## 2020-07-10 DIAGNOSIS — S0990XA Unspecified injury of head, initial encounter: Secondary | ICD-10-CM | POA: Insufficient documentation

## 2020-07-10 DIAGNOSIS — Y999 Unspecified external cause status: Secondary | ICD-10-CM | POA: Diagnosis not present

## 2020-07-10 DIAGNOSIS — S66912A Strain of unspecified muscle, fascia and tendon at wrist and hand level, left hand, initial encounter: Secondary | ICD-10-CM

## 2020-07-10 MED ORDER — BACLOFEN 10 MG PO TABS: 10.0000 mg | ORAL_TABLET | Freq: Three times a day (TID) | ORAL | 1 refills | Status: AC

## 2020-07-10 MED ORDER — MELOXICAM 15 MG PO TABS: 15.0000 mg | ORAL_TABLET | Freq: Every day | ORAL | 2 refills | Status: AC
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
## 2020-07-10 NOTE — Discharge Instructions (Addendum)
Follow-up with your regular doctor if not improving in 5 to 7 days.  Return emergency department worsening.  Continue to have left hand pain please follow-up with orthopedics.  They are listed above.

## 2021-01-01 ENCOUNTER — Other Ambulatory Visit: Payer: No Typology Code available for payment source

## 2021-01-01 ENCOUNTER — Other Ambulatory Visit: Payer: Self-pay

## 2021-01-01 DIAGNOSIS — Z20822 Contact with and (suspected) exposure to covid-19: Secondary | ICD-10-CM

## 2021-01-02 LAB — SARS-COV-2, NAA 2 DAY TAT

## 2021-01-02 LAB — NOVEL CORONAVIRUS, NAA: SARS-CoV-2, NAA: DETECTED — AB

## 2021-01-03 ENCOUNTER — Telehealth: Payer: Self-pay | Admitting: Family

## 2021-01-03 NOTE — Telephone Encounter (Signed)
2nd ATTEMPT. Called to discuss with patient about COVID-19 symptoms and the use of one of the available treatments for those with mild to moderate Covid symptoms and at a high risk of hospitalization.  Pt appears to qualify for outpatient treatment due to co-morbid conditions and/or a member of an at-risk group in accordance with the FDA Emergency Use Authorization.    Symptom onset:  Vaccinated:  Booster?  Immunocompromised?  Qualifiers:  I attempted to contact Alexis Odonnell to gather additional information and was unable to reach her via telephone. A voicemail with contact number was provided. Previous MyChart message that was sent was noted to be read.   Terri Piedra, NP 01/03/2021 1:58 PM

## 2021-08-17 ENCOUNTER — Ambulatory Visit (INDEPENDENT_AMBULATORY_CARE_PROVIDER_SITE_OTHER): Payer: No Typology Code available for payment source | Admitting: Obstetrics and Gynecology

## 2021-08-17 ENCOUNTER — Other Ambulatory Visit: Payer: Self-pay

## 2021-08-17 ENCOUNTER — Encounter: Payer: Self-pay | Admitting: Obstetrics and Gynecology

## 2021-08-17 ENCOUNTER — Other Ambulatory Visit (HOSPITAL_COMMUNITY)
Admission: RE | Admit: 2021-08-17 | Discharge: 2021-08-17 | Disposition: A | Discharge status: Home / Self Care | Payer: PRIVATE HEALTH INSURANCE | Source: Ambulatory Visit | Attending: Obstetrics and Gynecology | Admitting: Obstetrics and Gynecology

## 2021-08-17 VITALS — BP 110/80 | Ht 61.0 in | Wt 139.0 lb

## 2021-08-17 DIAGNOSIS — B9689 Other specified bacterial agents as the cause of diseases classified elsewhere: Secondary | ICD-10-CM | POA: Diagnosis not present

## 2021-08-17 DIAGNOSIS — Z1211 Encounter for screening for malignant neoplasm of colon: Secondary | ICD-10-CM

## 2021-08-17 DIAGNOSIS — Z124 Encounter for screening for malignant neoplasm of cervix: Secondary | ICD-10-CM | POA: Diagnosis not present

## 2021-08-17 DIAGNOSIS — Z1231 Encounter for screening mammogram for malignant neoplasm of breast: Secondary | ICD-10-CM | POA: Diagnosis not present

## 2021-08-17 DIAGNOSIS — Z01419 Encounter for gynecological examination (general) (routine) without abnormal findings: Secondary | ICD-10-CM | POA: Diagnosis not present

## 2021-08-17 DIAGNOSIS — Z1151 Encounter for screening for human papillomavirus (HPV): Secondary | ICD-10-CM

## 2021-08-17 DIAGNOSIS — N760 Acute vaginitis: Secondary | ICD-10-CM

## 2021-08-17 LAB — POCT WET PREP WITH KOH
Clue Cells Wet Prep HPF POC: POSITIVE
KOH Prep POC: POSITIVE — AB
Trichomonas, UA: NEGATIVE
Yeast Wet Prep HPF POC: NEGATIVE

## 2021-08-17 MED ORDER — METRONIDAZOLE 500 MG PO TABS: 500.0000 mg | ORAL_TABLET | Freq: Two times a day (BID) | ORAL | 0 refills | Status: AC

## 2021-08-17 NOTE — Progress Notes (Signed)
PCP:  Patient, No Pcp Per (Inactive)   Chief Complaint  Patient presents with   Gynecologic Exam    No concerns     HPI:      Alexis Odonnell is a 45 y.o. [phone] who LMP was Patient's last menstrual period was 08/13/2021 (exact date)., presents today for her annual examination.  Her menses are regular every 28-30 days, lasting 3 days.  Dysmenorrhea mild, occurring first 1-2 days of flow. She does not have intermenstrual bleeding. No vasomotor sx.   Hx of recurrent BV in past but no sx until recently. Noticed fishy odor before LMP and treated with OTC insert with some relief. Not sure if still has sx since just finished her period. Tx with flagyl and clindamycin in past.   Sex activity: single partner, contraception - tubal ligation. No pain/bleeding. Last Pap: May 05, 2017  Results were: no abnormalities /no HPV DNA done Hx of STDs: none  Last mammogram: November 26, 2019  Results were: normal after addl views--routine follow-up in 12 months There is no FH of breast cancer. There is no FH of ovarian cancer. The patient does not do self-breast exams. Gets breast tenderness before menses.   Tobacco use: The patient denies current or previous tobacco use. Alcohol use: none No drug use.  Exercise: not active  Colonoscopy: never  She does get adequate calcium but not Vitamin D in her diet.   Past Medical History:  Diagnosis Date   BV (bacterial vaginosis)    Ovarian cyst      Past Surgical History:  Procedure Laterality Date   CESAREAN SECTION     TUBAL LIGATION     TUBAL LIGATION      Family History  Problem Relation Age of Onset   Hypertension Mother    Diabetes Father    Hyperlipidemia Brother    Breast cancer Neg Hx    Ovarian cancer Neg Hx     Social History   Socioeconomic History   Marital status: Married    Spouse name: Not on file   Number of children: Not on file   Years of education: Not on file   Highest education level: Not on file   Occupational History   Not on file  Tobacco Use   Smoking status: Never   Smokeless tobacco: Never  Vaping Use   Vaping Use: Never used  Substance and Sexual Activity   Alcohol use: No   Drug use: No   Sexual activity: Yes    Birth control/protection: Surgical    Comment: Tubal Ligation  Other Topics Concern   Not on file  Social History Narrative   Not on file   Social Determinants of Health   Financial Resource Strain: Not on file  Food Insecurity: Not on file  Transportation Needs: Not on file  Physical Activity: Not on file  Stress: Not on file  Social Connections: Not on file  Intimate Partner Violence: Not on file     Current Outpatient Medications:    metroNIDAZOLE (FLAGYL) 500 MG tablet, Take 1 tablet (500 mg total) by mouth 2 (two) times daily for 7 days., Disp: 14 tablet, Rfl: 0     ROS:  Review of Systems  Constitutional:  Negative for fatigue, fever and unexpected weight change.  Respiratory:  Negative for cough, shortness of breath and wheezing.   Cardiovascular:  Negative for chest pain, palpitations and leg swelling.  Gastrointestinal:  Negative for blood in stool, constipation, diarrhea, nausea and vomiting.  Endocrine: Negative for cold intolerance, heat intolerance and polyuria.  Genitourinary:  Positive for vaginal discharge. Negative for dyspareunia, dysuria, flank pain, frequency, genital sores, hematuria, menstrual problem, pelvic pain, urgency, vaginal bleeding and vaginal pain.  Musculoskeletal:  Negative for back pain, joint swelling and myalgias.  Skin:  Negative for rash.  Neurological:  Negative for dizziness, syncope, light-headedness, numbness and headaches.  Hematological:  Negative for adenopathy.  Psychiatric/Behavioral:  Negative for agitation, confusion, sleep disturbance and suicidal ideas. The patient is not nervous/anxious.  BREAST: No symptoms   Objective: BP 110/80   Ht 5\' 1"  (1.549 m)   Wt 139 lb (63 kg)   LMP  08/13/2021 (Exact Date)   BMI 26.26 kg/m    Physical Exam Constitutional:      Appearance: She is well-developed.  Genitourinary:     Vulva normal.     Right Labia: No rash, tenderness or lesions.    Left Labia: No tenderness, lesions or rash.    No vaginal discharge, erythema or tenderness.      Right Adnexa: not tender and no mass present.    Left Adnexa: not tender and no mass present.    No cervical friability or polyp.     Uterus is not enlarged or tender.  Breasts:    Right: No mass, nipple discharge, skin change or tenderness.     Left: No mass, nipple discharge, skin change or tenderness.  Neck:     Thyroid: No thyromegaly.  Cardiovascular:     Rate and Rhythm: Normal rate and regular rhythm.     Heart sounds: Normal heart sounds. No murmur heard. Pulmonary:     Effort: Pulmonary effort is normal.     Breath sounds: Normal breath sounds.  Abdominal:     Palpations: Abdomen is soft.     Tenderness: There is no abdominal tenderness. There is no guarding or rebound.  Musculoskeletal:        General: Normal range of motion.     Cervical back: Normal range of motion.  Lymphadenopathy:     Cervical: No cervical adenopathy.  Neurological:     General: No focal deficit present.     Mental Status: She is alert and oriented to person, place, and time.     Cranial Nerves: No cranial nerve deficit.  Skin:    General: Skin is warm and dry.  Psychiatric:        Mood and Affect: Mood normal.        Behavior: Behavior normal.        Thought Content: Thought content normal.        Judgment: Judgment normal.  Vitals reviewed.   Results for orders placed or performed in visit on 08/17/21 (from the past 24 hour(s))  POCT Wet Prep with KOH     Status: Abnormal   Collection Time: 08/17/21  5:04 PM  Result Value Ref Range   Trichomonas, UA Negative    Clue Cells Wet Prep HPF POC pos    Epithelial Wet Prep HPF POC     Yeast Wet Prep HPF POC neg    Bacteria Wet Prep HPF POC      RBC Wet Prep HPF POC     WBC Wet Prep HPF POC     KOH Prep POC Positive (A) Negative     Assessment/Plan: Encounter for annual routine gynecological examination  Cervical cancer screening - Plan: Cytology - PAP  Screening for HPV (human papillomavirus) - Plan: Cytology - PAP  Encounter  for screening mammogram for malignant neoplasm of breast - Plan: MM 3D SCREEN BREAST BILATERAL; pt to sched mammo  Screening for colon cancer - Plan: Ambulatory referral to Gastroenterology; refer to GI for colonoscopy due to age  BV (bacterial vaginosis) - Plan: metroNIDAZOLE (FLAGYL) 500 MG tablet, POCT Wet Prep with KOH; pos sx and wet prep. Rx flagyl, no EtOH. Will RF if sx recur. F/u prn.   Meds ordered this encounter  Medications   metroNIDAZOLE (FLAGYL) 500 MG tablet    Sig: Take 1 tablet (500 mg total) by mouth 2 (two) times daily for 7 days.    Dispense:  14 tablet    Refill:  0    Order Specific Question:   Supervising Provider    Answer:   Gae Dry [[id number]]           GYN counsel mammography screening, adequate intake of calcium and vitamin D, diet and exercise     F/U  Return in about 1 year (around 08/17/2022).  Orvan Papadakis B. Shaterria Sager, PA-C 08/17/2021 5:04 PM

## 2021-08-17 NOTE — Patient Instructions (Addendum)
I value your feedback and you entrusting us with your care. If you get a [place] patient survey, I would appreciate you taking the time to let us know about your experience today. Thank you!  [facility]: [phone]  [facility]: [phone]    

## 2021-08-19 ENCOUNTER — Other Ambulatory Visit: Payer: Self-pay

## 2021-08-19 DIAGNOSIS — Z1211 Encounter for screening for malignant neoplasm of colon: Secondary | ICD-10-CM

## 2021-08-19 LAB — CYTOLOGY - PAP
Comment: NEGATIVE
Diagnosis: NEGATIVE
High risk HPV: NEGATIVE

## 2021-08-19 MED ORDER — NA SULFATE-K SULFATE-MG SULF 17.5-3.13-1.6 GM/177ML PO SOLN: 1.0000 | Freq: Once | ORAL | 0 refills | Status: AC

## 2021-08-19 NOTE — Progress Notes (Signed)
Gastroenterology Pre-Procedure Review  Request Date: 09/08/21 Requesting Physician: Dr. Vicente Males  PATIENT REVIEW QUESTIONS: The patient responded to the following health history questions as indicated:    1. Are you having any GI issues? no 2. Do you have a personal history of Polyps? no 3. Do you have a family history of Colon Cancer or Polyps? no 4. Diabetes Mellitus? no 5. Joint replacements in the past 12 months?no 6. Major health problems in the past 3 months?no 7. Any artificial heart valves, MVP, or defibrillator?no    MEDICATIONS & ALLERGIES:    Patient reports the following regarding taking any anticoagulation/antiplatelet therapy:   Plavix, Coumadin, Eliquis, Xarelto, Lovenox, Pradaxa, Brilinta, or Effient? no Aspirin? no  Patient confirms/reports the following medications:  Current Outpatient Medications  Medication Sig Dispense Refill   metroNIDAZOLE (FLAGYL) 500 MG tablet Take 1 tablet (500 mg total) by mouth 2 (two) times daily for 7 days. 14 tablet 0   No current facility-administered medications for this visit.    Patient confirms/reports the following allergies:  No Known Allergies  No orders of the defined types were placed in this encounter.   AUTHORIZATION INFORMATION Primary Insurance: 1D#: Group #:  Secondary Insurance: 1D#: Group #:  SCHEDULE INFORMATION: Date: 09/08/21  Time: Location: [facility]

## 2021-08-30 ENCOUNTER — Other Ambulatory Visit: Payer: Self-pay | Admitting: Internal Medicine

## 2021-09-01 ENCOUNTER — Other Ambulatory Visit: Payer: Self-pay

## 2021-09-01 ENCOUNTER — Ambulatory Visit
Admission: RE | Admit: 2021-09-01 | Discharge: 2021-09-01 | Disposition: A | Discharge status: Home / Self Care | Payer: No Typology Code available for payment source | Source: Ambulatory Visit | Attending: Obstetrics and Gynecology | Admitting: Obstetrics and Gynecology

## 2021-09-01 DIAGNOSIS — Z1231 Encounter for screening mammogram for malignant neoplasm of breast: Secondary | ICD-10-CM | POA: Insufficient documentation

## 2021-09-08 ENCOUNTER — Ambulatory Visit: Payer: PRIVATE HEALTH INSURANCE | Admitting: Certified Registered Nurse Anesthetist

## 2021-09-08 ENCOUNTER — Ambulatory Visit
Admission: RE | Admit: 2021-09-08 | Discharge: 2021-09-08 | Disposition: A | Discharge status: Home / Self Care | Payer: PRIVATE HEALTH INSURANCE | Attending: Gastroenterology | Admitting: Gastroenterology

## 2021-09-08 ENCOUNTER — Encounter: Payer: Self-pay | Admitting: Gastroenterology

## 2021-09-08 ENCOUNTER — Encounter
Admission: RE | Disposition: A | Discharge status: Home / Self Care | Payer: Self-pay | Source: Home / Self Care | Attending: Gastroenterology

## 2021-09-08 DIAGNOSIS — D126 Benign neoplasm of colon, unspecified: Secondary | ICD-10-CM | POA: Diagnosis not present

## 2021-09-08 DIAGNOSIS — Z79890 Hormone replacement therapy (postmenopausal): Secondary | ICD-10-CM | POA: Diagnosis not present

## 2021-09-08 DIAGNOSIS — K635 Polyp of colon: Secondary | ICD-10-CM | POA: Diagnosis not present

## 2021-09-08 DIAGNOSIS — Z1211 Encounter for screening for malignant neoplasm of colon: Secondary | ICD-10-CM | POA: Insufficient documentation

## 2021-09-08 HISTORY — PX: COLONOSCOPY WITH PROPOFOL: SHX5780

## 2021-09-08 LAB — POCT PREGNANCY, URINE: Preg Test, Ur: NEGATIVE

## 2021-09-08 SURGERY — COLONOSCOPY WITH PROPOFOL
Anesthesia: General

## 2021-09-08 MED ORDER — PROPOFOL 10 MG/ML IV BOLUS
INTRAVENOUS | Status: DC | PRN
  Administered 2021-09-08: 70 mg via INTRAVENOUS
  Administered 2021-09-08: 30 mg via INTRAVENOUS

## 2021-09-08 MED ORDER — GLYCOPYRROLATE 0.2 MG/ML IJ SOLN
INTRAMUSCULAR | Status: DC | PRN
  Administered 2021-09-08: .2 mg via INTRAVENOUS

## 2021-09-08 MED ORDER — LIDOCAINE HCL (CARDIAC) PF 100 MG/5ML IV SOSY
PREFILLED_SYRINGE | INTRAVENOUS | Status: DC | PRN
  Administered 2021-09-08: 50 mg via INTRAVENOUS

## 2021-09-08 MED ORDER — PHENYLEPHRINE HCL (PRESSORS) 10 MG/ML IV SOLN
INTRAVENOUS | Status: DC | PRN
  Administered 2021-09-08: 80 ug via INTRAVENOUS

## 2021-09-08 MED ORDER — SODIUM CHLORIDE 0.9 % IV SOLN
INTRAVENOUS | Status: DC
  Administered 2021-09-08: 1000 mL via INTRAVENOUS

## 2021-09-08 MED ORDER — PROPOFOL 500 MG/50ML IV EMUL
INTRAVENOUS | Status: DC | PRN
  Administered 2021-09-08: 160 ug/kg/min via INTRAVENOUS

## 2021-09-08 MED ORDER — DEXMEDETOMIDINE (PRECEDEX) IN NS 20 MCG/5ML (4 MCG/ML) IV SYRINGE
PREFILLED_SYRINGE | INTRAVENOUS | Status: DC | PRN
  Administered 2021-09-08: 8 ug via INTRAVENOUS

## 2021-09-08 NOTE — Op Note (Signed)
[facility] Patient Name: Alexis Odonnell Procedure Date: 09/08/2021 1:47 PM MRN: [id number] Account #: [id number] Date of Birth: 1976-11-24 Admit Type: Outpatient Age: 45 Room: [facility] ENDO ROOM 3 Gender: Female Note Status: Finalized Instrument Name: Jasper Riling [id number] Procedure:             Colonoscopy Indications:           Screening for colorectal malignant neoplasm Providers:             Jonathon Bellows MD, MD Medicines:             Monitored Anesthesia Care Complications:         No immediate complications. Procedure:             Pre-Anesthesia Assessment:                        - Prior to the procedure, a History and Physical was                         performed, and patient medications, allergies and                         sensitivities were reviewed. The patient's tolerance                         of previous anesthesia was reviewed.                        - The risks and benefits of the procedure and the                         sedation options and risks were discussed with the                         patient. All questions were answered and informed                         consent was obtained.                        - ASA Grade Assessment: II - A patient with mild                         systemic disease.                        After obtaining informed consent, the colonoscope was                         passed under direct vision. Throughout the procedure,                         the patient's blood pressure, pulse, and oxygen                         saturations were monitored continuously. The                         Colonoscope was introduced through the anus and  advanced to the the cecum, identified by the                         appendiceal orifice. The colonoscopy was performed                         with ease. The patient tolerated the procedure well.                         The quality of the bowel  preparation was excellent. Findings:      The perianal and digital rectal examinations were normal.      A 3 mm polyp was found in the transverse colon. The polyp was sessile.       The polyp was removed with a cold biopsy forceps. Resection and       retrieval were complete.      The exam was otherwise without abnormality on direct and retroflexion       views. Impression:            - One 3 mm polyp in the transverse colon, removed with                         a cold biopsy forceps. Resected and retrieved.                        - The examination was otherwise normal on direct and                         retroflexion views. Recommendation:        - Discharge patient to home (with escort).                        - Resume previous diet.                        - Continue present medications.                        - Await pathology results.                        - Repeat colonoscopy for surveillance based on                         pathology results. Procedure Code(s):     --- Professional ---                        [phone], Colonoscopy, flexible; with biopsy, single or                         multiple Diagnosis Code(s):     --- Professional ---                        Z12.11, Encounter for screening for malignant neoplasm                         of colon  K63.5, Polyp of colon CPT copyright 2019 American Medical Association. All rights reserved. The codes documented in this report are preliminary and upon coder review may  be revised to meet current compliance requirements. Jonathon Bellows, MD Jonathon Bellows MD, MD 09/08/2021 2:03:51 PM This report has been signed electronically. Number of Addenda: 0 Note Initiated On: 09/08/2021 1:47 PM Scope Withdrawal Time: 0 hours 7 minutes 6 seconds  Total Procedure Duration: 0 hours 9 minutes 30 seconds  Estimated Blood Loss:  Estimated blood loss: none.      [facility]

## 2021-09-08 NOTE — H&P (Signed)
     Jonathon Bellows, MD [address], [place], [place], [place], [address] [address], [place], [place], [place], [address] Phone: [phone]  Fax: [phone]  Primary Care Physician:  Chad Cordial, PA-C   Pre-Procedure History & Physical: HPI:  Adela Esteban is a 45 y.o. female is here for an colonoscopy.   Past Medical History:  Diagnosis Date   BV (bacterial vaginosis)    Ovarian cyst     Past Surgical History:  Procedure Laterality Date   CESAREAN SECTION     TUBAL LIGATION     TUBAL LIGATION      Prior to Admission medications   Medication Sig Start Date End Date Taking? Authorizing Provider  levothyroxine (SYNTHROID) 150 MCG tablet TAKE 1 TABLET BY MOUTH EVERY DAY--MAKE APPT FOR FURTHER REFILLS 08/30/21   Cletis Athens, MD    Allergies as of 08/19/2021   (No Known Allergies)    Family History  Problem Relation Age of Onset   Hypertension Mother    Diabetes Father    Hyperlipidemia Brother    Breast cancer Neg Hx    Ovarian cancer Neg Hx     Social History   Socioeconomic History   Marital status: Married    Spouse name: Not on file   Number of children: Not on file   Years of education: Not on file   Highest education level: Not on file  Occupational History   Not on file  Tobacco Use   Smoking status: Never   Smokeless tobacco: Never  Vaping Use   Vaping Use: Never used  Substance and Sexual Activity   Alcohol use: No   Drug use: No   Sexual activity: Yes    Birth control/protection: Surgical    Comment: Tubal Ligation  Other Topics Concern   Not on file  Social History Narrative   Not on file   Social Determinants of Health   Financial Resource Strain: Not on file  Food Insecurity: Not on file  Transportation Needs: Not on file  Physical Activity: Not on file  Stress: Not on file  Social Connections: Not on file  Intimate Partner Violence: Not on file    Review of Systems: See HPI, otherwise negative ROS  Physical  Exam: BP 120/71   Temp (!) 96.1 F (35.6 C)   Resp 18   Ht 5\' 1"  (1.549 m)   Wt 64.4 kg   LMP 09/08/2021 (Exact Date)   SpO2 100%   BMI 26.83 kg/m  General:   Alert,  pleasant and cooperative in NAD Head:  Normocephalic and atraumatic. Neck:  Supple; no masses or thyromegaly. Lungs:  Clear throughout to auscultation, normal respiratory effort.    Heart:  +S1, +S2, Regular rate and rhythm, No edema. Abdomen:  Soft, nontender and nondistended. Normal bowel sounds, without guarding, and without rebound.   Neurologic:  Alert and  oriented x4;  grossly normal neurologically.  Impression/Plan: Brandalyn Harting is here for an colonoscopy to be performed for Screening colonoscopy average risk   Risks, benefits, limitations, and alternatives regarding  colonoscopy have been reviewed with the patient.  Questions have been answered.  All parties agreeable.   Jonathon Bellows, MD  09/08/2021, 1:42 PM

## 2021-09-08 NOTE — Anesthesia Preprocedure Evaluation (Signed)
Anesthesia Evaluation  Patient identified by MRN, date of birth, ID band Patient awake    Reviewed: Allergy & Precautions, NPO status , Patient's Chart, lab work & pertinent test results  History of Anesthesia Complications Negative for: history of anesthetic complications  Airway Mallampati: III   Neck ROM: Full    Dental no notable dental hx.    Pulmonary neg pulmonary ROS,    Pulmonary exam normal breath sounds clear to auscultation       Cardiovascular Exercise Tolerance: Good negative cardio ROS Normal cardiovascular exam Rhythm:Regular Rate:Normal     Neuro/Psych negative neurological ROS     GI/Hepatic negative GI ROS,   Endo/Other  Hypothyroidism   Renal/GU negative Renal ROS     Musculoskeletal   Abdominal   Peds  Hematology negative hematology ROS (+)   Anesthesia Other Findings   Reproductive/Obstetrics                             Anesthesia Physical Anesthesia Plan  ASA: 2  Anesthesia Plan: General   Post-op Pain Management:    Induction: Intravenous  PONV Risk Score and Plan: 3 and Propofol infusion, TIVA and Treatment may vary due to age or medical condition  Airway Management Planned: Natural Airway  Additional Equipment:   Intra-op Plan:   Post-operative Plan:   Informed Consent: I have reviewed the patients History and Physical, chart, labs and discussed the procedure including the risks, benefits and alternatives for the proposed anesthesia with the patient or authorized representative who has indicated his/her understanding and acceptance.       Plan Discussed with: CRNA  Anesthesia Plan Comments:         Anesthesia Quick Evaluation

## 2021-09-08 NOTE — Anesthesia Postprocedure Evaluation (Signed)
Anesthesia Post Note  Patient: Alexis Odonnell  Procedure(s) Performed: COLONOSCOPY WITH PROPOFOL  Patient location during evaluation: PACU Anesthesia Type: General Level of consciousness: awake and alert, oriented and patient cooperative Pain management: pain level controlled Vital Signs Assessment: post-procedure vital signs reviewed and stable Respiratory status: spontaneous breathing, nonlabored ventilation and respiratory function stable Cardiovascular status: blood pressure returned to baseline and stable Postop Assessment: adequate PO intake Anesthetic complications: no   No notable events documented.   Last Vitals:  Vitals:   09/08/21 1415 09/08/21 1425  BP: (!) 91/59 112/71  Pulse: 70 73  Resp: 15 17  Temp:    SpO2: 99% 100%    Last Pain:  Vitals:   09/08/21 1425  TempSrc:   PainSc: 0-No pain                 Darrin Nipper

## 2021-09-08 NOTE — Transfer of Care (Signed)
Immediate Anesthesia Transfer of Care Note  Patient: Alexis Odonnell  Procedure(s) Performed: COLONOSCOPY WITH PROPOFOL  Patient Location: PACU  Anesthesia Type:General  Level of Consciousness: drowsy  Airway & Oxygen Therapy: Patient Spontanous Breathing  Post-op Assessment: Report given to RN and Post -op Vital signs reviewed and stable  Post vital signs: Reviewed and stable  Last Vitals:  Vitals Value Taken Time  BP 92/59 09/08/21 1405  Temp 35.6 C 09/08/21 1405  Pulse 65 09/08/21 1406  Resp 16 09/08/21 1406  SpO2 98 % 09/08/21 1406  Vitals shown include unvalidated device data.  Last Pain:  Vitals:   09/08/21 1405  TempSrc: Temporal  PainSc:       Patients Stated Pain Goal: 0 (81/44/81 8563)  Complications: No notable events documented.

## 2021-09-09 ENCOUNTER — Encounter: Payer: Self-pay | Admitting: Gastroenterology

## 2021-09-10 LAB — SURGICAL PATHOLOGY

## 2021-09-13 ENCOUNTER — Encounter: Payer: Self-pay | Admitting: Gastroenterology

## 2021-10-06 IMAGING — MG MM DIGITAL SCREENING BILAT W/ TOMO AND CAD
8 series · 9 of 24 positions shown · non-contrast
Comparison: Previous exam(s).

CLINICAL DATA: Screening.

EXAM:
DIGITAL SCREENING BILATERAL MAMMOGRAM WITH TOMOSYNTHESIS AND CAD
TECHNIQUE: Bilateral screening digital craniocaudal and mediolateral oblique
mammograms were obtained. Bilateral screening digital breast
tomosynthesis was performed. The images were evaluated with
computer-aided detection.

[L CC synth-2D]
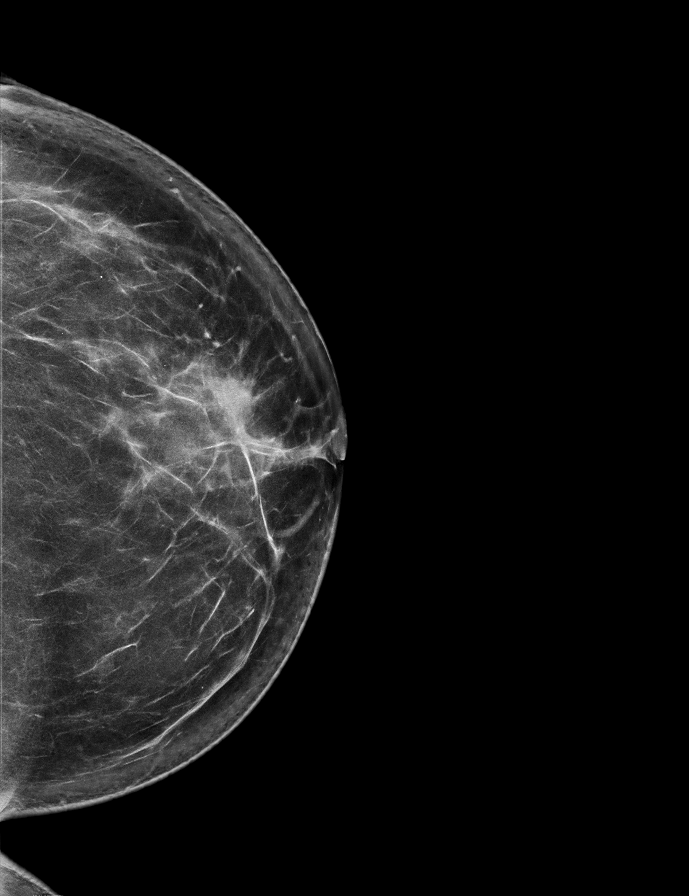

[R CC synth-2D]
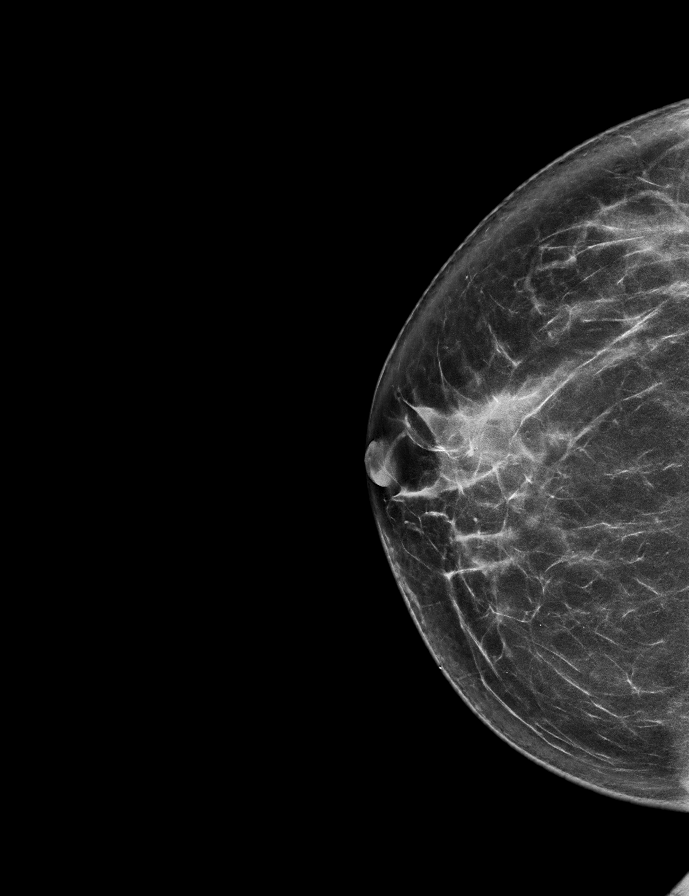

[R MLO synth-2D]
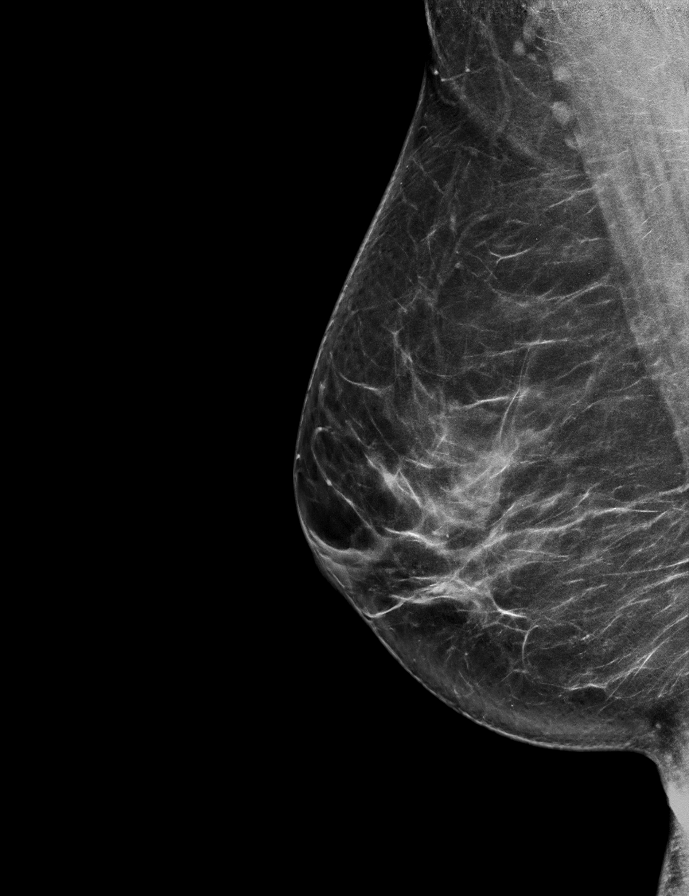

[L MLO synth-2D]
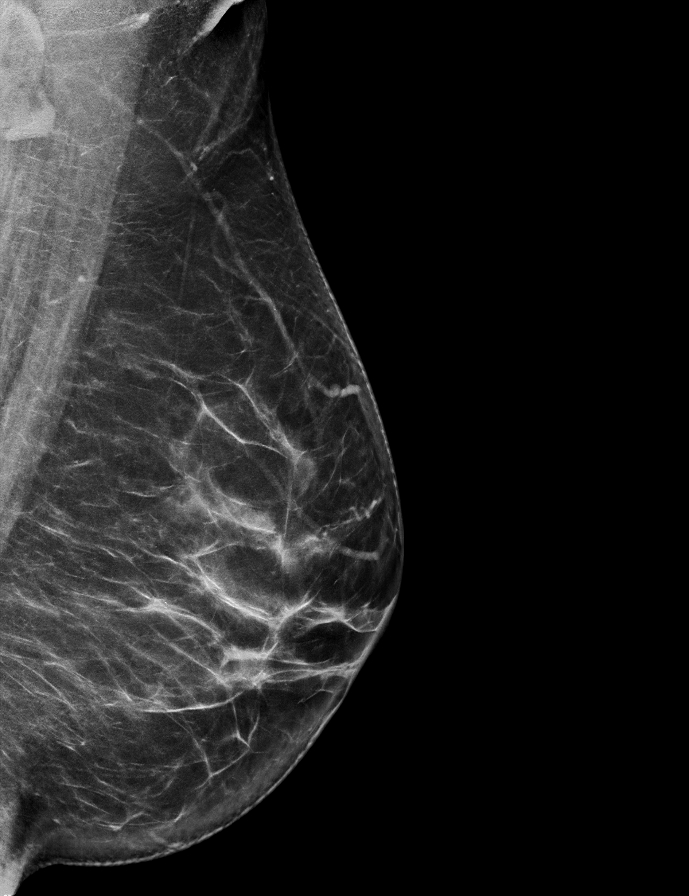

[L CC tomo · 2 of 76 frames shown]
[frame 25/76]
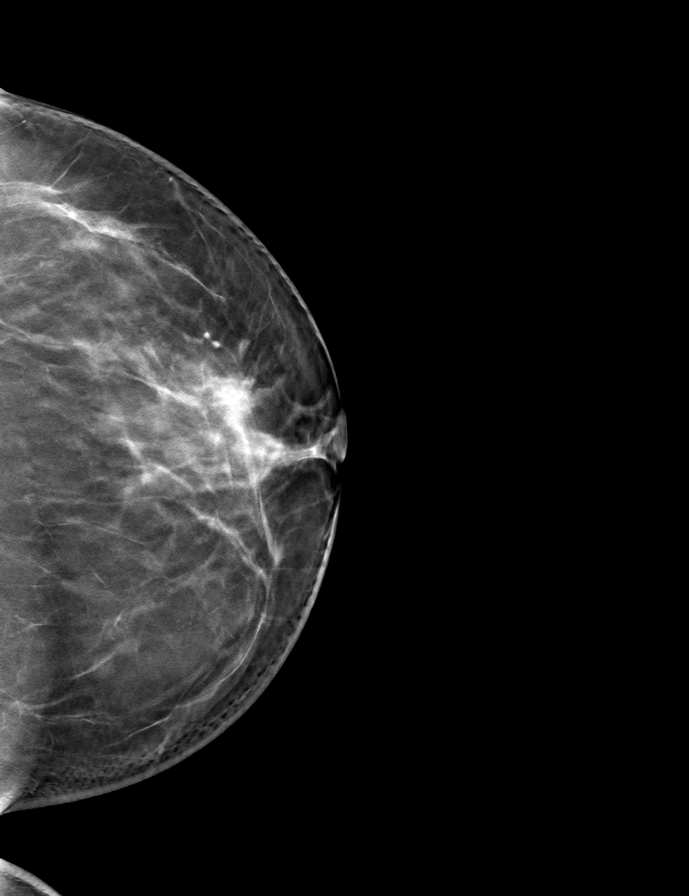
[frame 39/76]
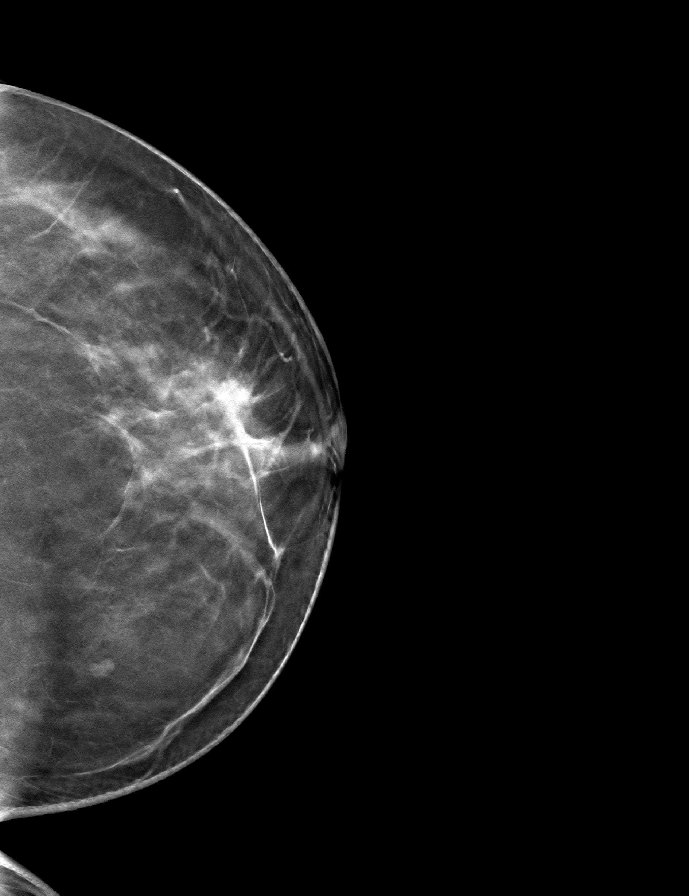

[L MLO tomo · tomo slice 39/76.0]
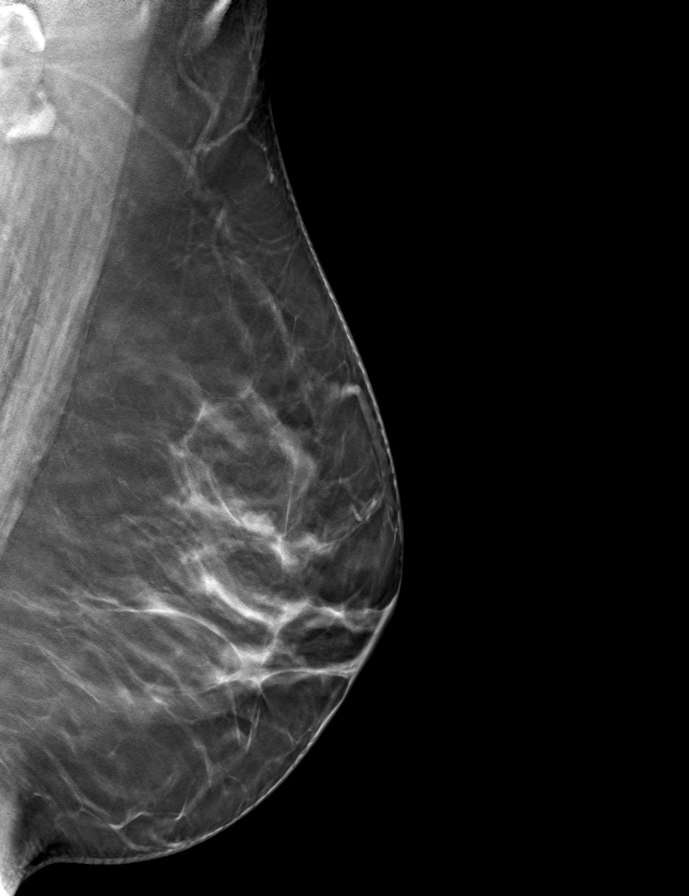

[R MLO tomo · tomo slice 37/74.0]
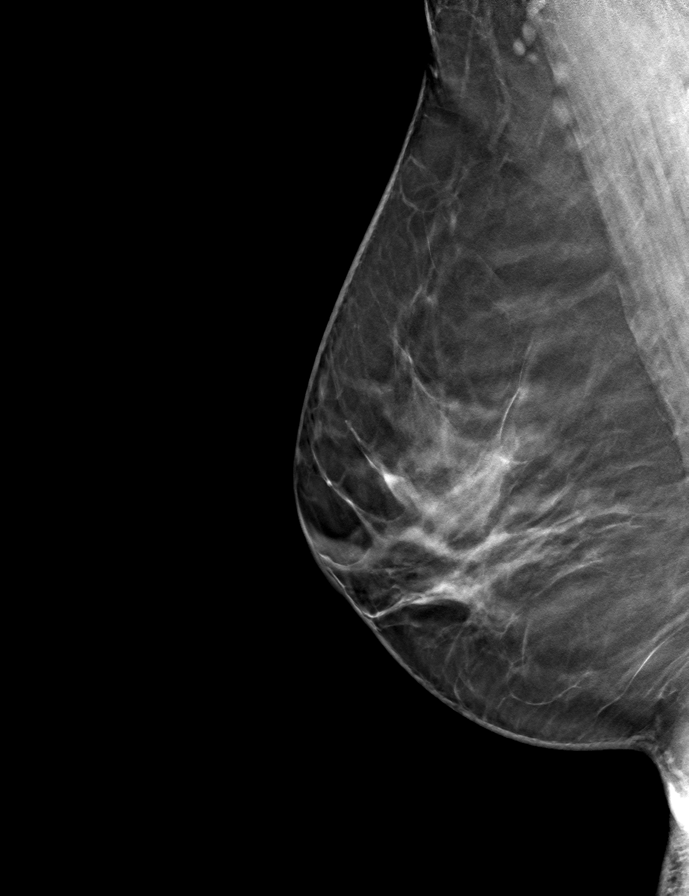

[R CC tomo · tomo slice 35/70.0]
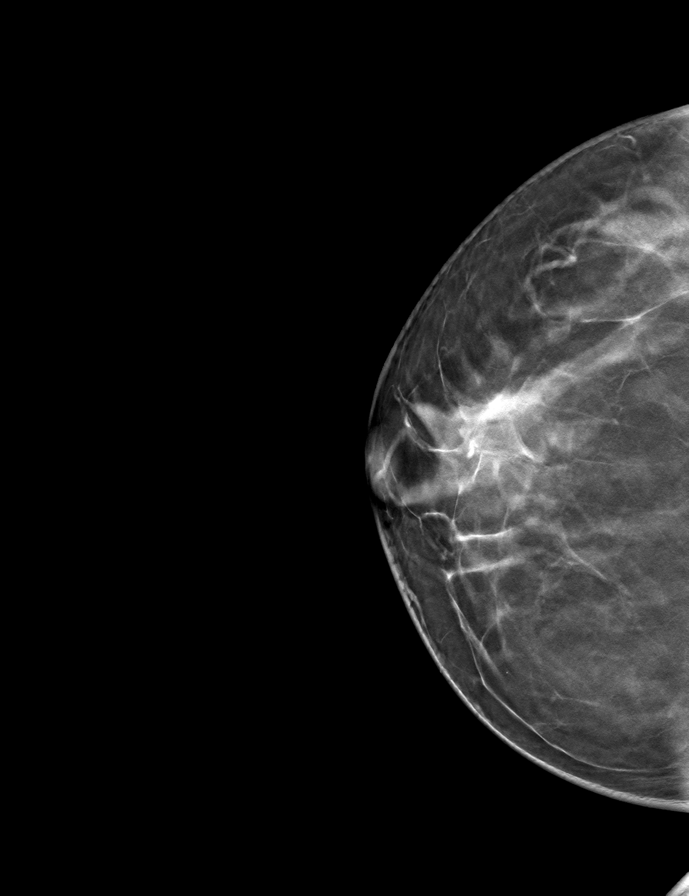

[9 of 24 positions shown; findings below may reference images not displayed]

ACR Breast Density Category b: There are scattered areas of
fibroglandular density.
FINDINGS: There are no findings suspicious for malignancy.
IMPRESSION: No mammographic evidence of malignancy. A result letter of this
screening mammogram will be mailed directly to the patient.

RECOMMENDATION:
Screening mammogram in one year. (Code:51-O-LD2)

BI-RADS CATEGORY  1: Negative.

## 2021-11-04 ENCOUNTER — Other Ambulatory Visit: Payer: Self-pay | Admitting: Obstetrics and Gynecology

## 2021-11-04 ENCOUNTER — Encounter: Payer: Self-pay | Admitting: Obstetrics and Gynecology

## 2021-11-04 DIAGNOSIS — N760 Acute vaginitis: Secondary | ICD-10-CM

## 2021-11-04 MED ORDER — FLUCONAZOLE 150 MG PO TABS: 150.0000 mg | ORAL_TABLET | Freq: Once | ORAL | 0 refills | Status: AC

## 2021-11-04 NOTE — Progress Notes (Signed)
Rx diflucan for yeast vag sx.  

## 2022-01-05 ENCOUNTER — Other Ambulatory Visit: Payer: Self-pay

## 2022-01-05 ENCOUNTER — Ambulatory Visit
Admission: RE | Admit: 2022-01-05 | Discharge: 2022-01-05 | Disposition: A | Discharge status: Home / Self Care | Payer: BC Managed Care – PPO | Source: Ambulatory Visit | Attending: Medical Oncology | Admitting: Medical Oncology

## 2022-01-05 VITALS — BP 142/86 | HR 87 | Temp 98.0°F | Resp 20

## 2022-01-05 DIAGNOSIS — H9202 Otalgia, left ear: Secondary | ICD-10-CM | POA: Diagnosis not present

## 2022-01-05 DIAGNOSIS — R0981 Nasal congestion: Secondary | ICD-10-CM | POA: Diagnosis not present

## 2022-01-05 MED ORDER — FLUTICASONE PROPIONATE 50 MCG/ACT NA SUSP: 2.0000 | Freq: Every day | NASAL | 0 refills | Status: DC

## 2022-01-05 MED ORDER — PREDNISONE 10 MG PO TABS
20.0000 mg | ORAL_TABLET | Freq: Every day | ORAL | 0 refills | Status: AC
Start: 2022-01-05 — End: 2022-01-10

## 2022-01-05 NOTE — ED Triage Notes (Signed)
Pt here with runny nose and congestion with left ear pain since yesterday.

## 2022-01-05 NOTE — ED Provider Notes (Signed)
Roderic Palau    CSN: [id number] Arrival date & time: 01/05/22  1459      History   Chief Complaint Chief Complaint  Patient presents with   Nasal Congestion   Otalgia    HPI Alexis Odonnell is a 46 y.o. female.   HPI  Otalgia: Patient states that she has had nasal congestion for the past few days.  Since yesterday she has had left ear pain.  She has tried Mucinex without much improvement.  She denies any fevers, shortness of breath, sore throat.  Few sick contacts at work with unknown illness.  History reviewed. No pertinent past medical history.  There are no problems to display for this patient.   History reviewed. No pertinent surgical history.  OB History   No obstetric history on file.      Home Medications    Prior to Admission medications   Not on File    Family History Family History  Family history unknown: Yes    Social History Social History   Tobacco Use   Smoking status: Never   Smokeless tobacco: Never  Substance Use Topics   Alcohol use: Never   Drug use: Never     Allergies   Patient has no known allergies.   Review of Systems Review of Systems  As stated above in HPI Physical Exam Triage Vital Signs ED Triage Vitals  Enc Vitals Group     BP 01/05/22 1616 (!) 142/86     Pulse Rate 01/05/22 1616 87     Resp 01/05/22 1616 20     Temp 01/05/22 1616 98 F (36.7 C)     Temp Source 01/05/22 1616 Oral     SpO2 01/05/22 1616 98 %     Weight --      Height --      Head Circumference --      Peak Flow --      Pain Score 01/05/22 1618 8     Pain Loc --      Pain Edu? --      Excl. in [place]? --    No data found.  Updated Vital Signs BP (!) 142/86    Pulse 87    Temp 98 F (36.7 C) (Oral)    Resp 20    SpO2 98%   Physical Exam Vitals and nursing note reviewed.  Constitutional:      General: She is not in acute distress.    Appearance: Normal appearance. She is not ill-appearing, toxic-appearing or diaphoretic.   HENT:     Head: Normocephalic and atraumatic.     Right Ear: Tympanic membrane normal.     Left Ear: Tympanic membrane normal.     Ears:     Comments: Left TM with edema with mild erythema    Nose: Congestion and rhinorrhea present.     Mouth/Throat:     Mouth: Mucous membranes are moist.     Pharynx: Oropharynx is clear. No oropharyngeal exudate or posterior oropharyngeal erythema.  Eyes:     General:        Right eye: No discharge.        Left eye: No discharge.     Extraocular Movements: Extraocular movements intact.     Conjunctiva/sclera: Conjunctivae normal.     Pupils: Pupils are equal, round, and reactive to light.  Cardiovascular:     Rate and Rhythm: Normal rate and regular rhythm.     Heart sounds: Normal heart sounds.  Pulmonary:  Effort: Pulmonary effort is normal. No respiratory distress.     Breath sounds: Normal breath sounds. No stridor. No wheezing or rhonchi.  Abdominal:     Palpations: Abdomen is soft.  Musculoskeletal:     Cervical back: Normal range of motion and neck supple.  Lymphadenopathy:     Cervical: No cervical adenopathy.  Skin:    General: Skin is warm.     Coloration: Skin is not jaundiced.     Findings: No erythema or rash.  Neurological:     Mental Status: She is alert and oriented to person, place, and time.     UC Treatments / Results  Labs (all labs ordered are listed, but only abnormal results are displayed) Labs Reviewed - No data to display  EKG   Radiology No results found.  Procedures Procedures (including critical care time)  Medications Ordered in UC Medications - No data to display  Initial Impression / Assessment and Plan / UC Course  I have reviewed the triage vital signs and the nursing notes.  Pertinent labs & imaging results that were available during my care of the patient were reviewed by me and considered in my medical decision making (see chart for details).     New.  She appears to have sinus  congestion and potentially developing ear infection.  I recommended sinus rinses along with Flonase and low-dose steroid to help with her symptoms.  Discussed how to take these medications.  Discussed red flag signs and symptoms.  If she develops a fever or her symptoms worsen such as producing ear pain that is worsening or bilateral she will call back for Augmentin. Final Clinical Impressions(s) / UC Diagnoses   Final diagnoses:  None   Discharge Instructions   None    ED Prescriptions   None    PDMP not reviewed this encounter.   Hughie Closs, [place] 01/05/22 1643

## 2022-01-06 ENCOUNTER — Encounter: Payer: Self-pay | Admitting: Gastroenterology

## 2023-03-07 ENCOUNTER — Encounter: Payer: Self-pay | Admitting: Family Medicine

## 2023-03-07 ENCOUNTER — Ambulatory Visit: Payer: BC Managed Care – PPO | Admitting: Family Medicine

## 2023-03-07 VITALS — BP 112/68 | HR 75 | Temp 97.9°F | Ht 60.75 in | Wt 135.8 lb

## 2023-03-07 DIAGNOSIS — Z13220 Encounter for screening for lipoid disorders: Secondary | ICD-10-CM | POA: Diagnosis not present

## 2023-03-07 DIAGNOSIS — Z0000 Encounter for general adult medical examination without abnormal findings: Secondary | ICD-10-CM

## 2023-03-07 DIAGNOSIS — Z8349 Family history of other endocrine, nutritional and metabolic diseases: Secondary | ICD-10-CM

## 2023-03-07 NOTE — Progress Notes (Signed)
Complete physical exam  Patient: Alexis Odonnell   DOB: 1976/01/24   47 y.o. Female  MRN: [id number]  Subjective:    Chief Complaint  Patient presents with   Establish Care    Alexis Odonnell is a 47 y.o. female who presents today for a complete physical exam. She reports consuming a general diet. The patient does not participate in regular exercise at present. She generally feels well. She reports sleeping well. She does not have additional problems to discuss today.   We reviewed her health maintenance measures, she is UTD on her pap and colonoscopy.   Most recent fall risk assessment:     No data to display           Most recent depression screenings:    03/07/2023    3:35 PM  PHQ 2/9 Scores  PHQ - 2 Score 0    Vision:Within last year and Dental: No current dental problems and Receives regular dental care  Patient Active Problem List   Diagnosis Date Noted   External hemorrhoid 12/05/2019      Patient Care Team: Pcp, No as PCP - General Copland, Ilona Sorrel, PA-C (Obstetrics and Gynecology)   Outpatient Medications Prior to Visit  Medication Sig   [DISCONTINUED] fluticasone (FLONASE) 50 MCG/ACT nasal spray Place 2 sprays into both nostrils daily.   [DISCONTINUED] levothyroxine (SYNTHROID) 150 MCG tablet TAKE 1 TABLET BY MOUTH EVERY DAY--MAKE APPT FOR FURTHER REFILLS   No facility-administered medications prior to visit.    Review of Systems  HENT:  Negative for hearing loss.   Eyes:  Negative for blurred vision.  Respiratory:  Negative for shortness of breath.   Cardiovascular:  Negative for chest pain.  Gastrointestinal: Negative.   Genitourinary: Negative.   Musculoskeletal:  Negative for back pain.  Neurological:  Negative for headaches.  Psychiatric/Behavioral:  Negative for depression.   All other systems reviewed and are negative.         Objective:     BP 112/68 (BP Location: Left Arm, Patient Position: Sitting, Cuff Size: Normal)   Pulse 75    Temp 97.9 F (36.6 C) (Oral)   Ht 5' 0.75" (1.543 m)   Wt 135 lb 12.8 oz (61.6 kg)   LMP 02/15/2023 (Exact Date)   SpO2 100%   BMI 25.87 kg/m    Physical Exam Vitals reviewed.  Constitutional:      Appearance: Normal appearance. She is well-groomed and normal weight.  HENT:     Right Ear: Tympanic membrane and ear canal normal.     Left Ear: Tympanic membrane and ear canal normal.     Mouth/Throat:     Mouth: Mucous membranes are moist.     Pharynx: No posterior oropharyngeal erythema.  Eyes:     Conjunctiva/sclera: Conjunctivae normal.  Neck:     Thyroid: No thyromegaly.  Cardiovascular:     Rate and Rhythm: Normal rate and regular rhythm.     Pulses: Normal pulses.     Heart sounds: S1 normal and S2 normal.  Pulmonary:     Effort: Pulmonary effort is normal.     Breath sounds: Normal breath sounds and air entry.  Abdominal:     General: Bowel sounds are normal.  Musculoskeletal:     Right lower leg: No edema.     Left lower leg: No edema.  Skin:    General: Skin is warm and dry.     Findings: No rash.  Neurological:     Mental Status:  She is alert and oriented to person, place, and time. Mental status is at baseline.     Gait: Gait is intact.  Psychiatric:        Mood and Affect: Mood and affect normal.        Speech: Speech normal.        Behavior: Behavior normal.        Judgment: Judgment normal.      No results found for any visits on 03/07/23.     Assessment & Plan:    Routine Health Maintenance and Physical Exam   There is no immunization history on file for this patient.  Health Maintenance  Topic Date Due   Hepatitis C Screening  Never done   DTaP/Tdap/Td (1 - Tdap) Never done   COVID-19 Vaccine (1) 03/23/2023 (Originally 07/04/1976)   HIV Screening  03/06/2024 (Originally [birth year]1990)   INFLUENZA VACCINE  06/29/2023   PAP SMEAR-Modifier  08/17/2026   COLONOSCOPY (Pts 45-16yrs Insurance coverage will need to be confirmed)  09/09/2031   HPV  VACCINES  Aged Out    Discussed health benefits of physical activity, and encouraged her to engage in regular exercise appropriate for her age and condition.  Problem List Items Addressed This Visit   None Visit Diagnoses     Lipid screening    -  Primary   Relevant Orders   Lipid Panel   Family history of hypothyroidism       Relevant Orders   TSH   Preventative health care       Relevant Orders   CMP   CBC with Differential/Platelets     Normal physical exam findings today, I reviewed all aspects of the patient's PMH including family and social history as listed above. I have ordered annual blood work for her which she will return to get.   Counseled patient on healthy exercise and handouts were given.   Return in about 1 year (around 03/06/2024) for annual physical exam.     Karie Georges, MD

## 2023-03-09 ENCOUNTER — Other Ambulatory Visit (INDEPENDENT_AMBULATORY_CARE_PROVIDER_SITE_OTHER): Payer: BC Managed Care – PPO

## 2023-03-09 DIAGNOSIS — Z13220 Encounter for screening for lipoid disorders: Secondary | ICD-10-CM

## 2023-03-09 DIAGNOSIS — Z8349 Family history of other endocrine, nutritional and metabolic diseases: Secondary | ICD-10-CM | POA: Diagnosis not present

## 2023-03-09 DIAGNOSIS — D649 Anemia, unspecified: Secondary | ICD-10-CM | POA: Diagnosis not present

## 2023-03-09 DIAGNOSIS — Z0000 Encounter for general adult medical examination without abnormal findings: Secondary | ICD-10-CM

## 2023-03-09 LAB — TSH: TSH: 5.17 u[IU]/mL (ref 0.35–5.50)

## 2023-03-09 LAB — COMPREHENSIVE METABOLIC PANEL
ALT: 13 U/L (ref 0–35)
AST: 13 U/L (ref 0–37)
Albumin: 3.9 g/dL (ref 3.5–5.2)
Alkaline Phosphatase: 77 U/L (ref 39–117)
BUN: 20 mg/dL (ref 6–23)
CO2: 25 mEq/L (ref 19–32)
Calcium: 9 mg/dL (ref 8.4–10.5)
Chloride: 105 mEq/L (ref 96–112)
Creatinine, Ser: 0.74 mg/dL (ref 0.40–1.20)
GFR: 95.84 mL/min (ref >60.00)
Glucose, Bld: 97 mg/dL (ref 70–99)
Potassium: 3.9 mEq/L (ref 3.5–5.1)
Sodium: 136 mEq/L (ref 135–145)
Total Bilirubin: 0.4 mg/dL (ref 0.2–1.2)
Total Protein: 7.2 g/dL (ref 6.0–8.3)

## 2023-03-09 LAB — IBC + FERRITIN
Ferritin: 3.3 ng/mL — ABNORMAL LOW (ref 10.0–291.0)
Iron: 20 ug/dL — ABNORMAL LOW (ref 42–145)
Saturation Ratios: 3.7 % — ABNORMAL LOW (ref 20.0–50.0)
TIBC: 533.4 ug/dL — ABNORMAL HIGH (ref 250.0–450.0)
Transferrin: 381 mg/dL — ABNORMAL HIGH (ref 212.0–360.0)

## 2023-03-09 LAB — CBC WITH DIFFERENTIAL/PLATELET
Basophils Absolute: 0 10*3/uL (ref 0.0–0.1)
Basophils Relative: 0.6 % (ref 0.0–3.0)
Eosinophils Absolute: 0.2 10*3/uL (ref 0.0–0.7)
Eosinophils Relative: 2 % (ref 0.0–5.0)
HCT: 29.7 % — ABNORMAL LOW (ref 36.0–46.0)
Hemoglobin: 9.4 g/dL — ABNORMAL LOW (ref 12.0–15.0)
Lymphocytes Relative: 32 % (ref 12.0–46.0)
Lymphs Abs: 2.5 10*3/uL (ref 0.7–4.0)
MCHC: 31.8 g/dL (ref 30.0–36.0)
MCV: 67.8 fl — ABNORMAL LOW (ref 78.0–100.0)
Monocytes Absolute: 0.6 10*3/uL (ref 0.1–1.0)
Monocytes Relative: 7.6 % (ref 3.0–12.0)
Neutro Abs: 4.6 10*3/uL (ref 1.4–7.7)
Neutrophils Relative %: 57.8 % (ref 43.0–77.0)
Platelets: 338 10*3/uL (ref 150.0–400.0)
RBC: 4.39 Mil/uL (ref 3.87–5.11)
RDW: 18 % — ABNORMAL HIGH (ref 11.5–15.5)
WBC: 7.9 10*3/uL (ref 4.0–10.5)

## 2023-03-09 LAB — LIPID PANEL
Cholesterol: 178 mg/dL (ref 0–200)
HDL: 46.2 mg/dL (ref >39.00)
LDL Cholesterol: 96 mg/dL (ref 0–99)
NonHDL: 132.03
Total CHOL/HDL Ratio: 4
Triglycerides: 182 mg/dL — ABNORMAL HIGH (ref 0.0–149.0)
VLDL: 36.4 mg/dL (ref 0.0–40.0)

## 2023-03-30 ENCOUNTER — Encounter: Payer: Self-pay | Admitting: Family Medicine

## 2023-06-04 NOTE — Progress Notes (Deleted)
PCP:  Karie Georges, MD   No chief complaint on file.    HPI:      Alexis Odonnell is a 47 y.o. [phone] who LMP was No LMP recorded., presents today for her annual examination.  Her menses are regular every 28-30 days, lasting 3 days.  Dysmenorrhea mild, occurring first 1-2 days of flow. She does not have intermenstrual bleeding. No vasomotor sx.   Hx of recurrent BV in past but no sx until recently. Noticed fishy odor before LMP and treated with OTC insert with some relief. Not sure if still has sx since just finished her period. Tx with flagyl and clindamycin in past.   Sex activity: single partner, contraception - tubal ligation. No pain/bleeding. Last Pap: 08/17/21 Results were: no abnormalities /neg HPV DNA Hx of STDs: none  Last mammogram: 09/01/21  Results were: normal after addl views--routine follow-up in 12 months There is no FH of breast cancer. There is no FH of ovarian cancer. The patient does not do self-breast exams. Gets breast tenderness before menses.   Tobacco use: The patient denies current or previous tobacco use. Alcohol use: none No drug use.  Exercise: not active  Colonoscopy: 10/22 at [facility]; repeat due after 10 yrs  She does get adequate calcium but not Vitamin D in her diet.   Past Medical History:  Diagnosis Date   BV (bacterial vaginosis)    Hypercholesteremia    Ovarian cyst      Past Surgical History:  Procedure Laterality Date   CESAREAN SECTION     COLONOSCOPY WITH PROPOFOL N/A 09/08/2021   Procedure: COLONOSCOPY WITH PROPOFOL;  Surgeon: Wyline Mood, MD;  Location: [facility] ENDOSCOPY;  Service: Gastroenterology;  Laterality: N/A;   TUBAL LIGATION     TUBAL LIGATION      Family History  Problem Relation Age of Onset   Hypertension Mother    Diabetes Father    Hyperlipidemia Brother    Breast cancer Neg Hx    Ovarian cancer Neg Hx     Social History   Socioeconomic History   Marital status: Married     Spouse name: Not on file   Number of children: Not on file   Years of education: Not on file   Highest education level: Not on file  Occupational History   Not on file  Tobacco Use   Smoking status: Never   Smokeless tobacco: Never  Vaping Use   Vaping Use: Never used  Substance and Sexual Activity   Alcohol use: Never   Drug use: Never   Sexual activity: Yes    Birth control/protection: Surgical    Comment: Tubal Ligation  Other Topics Concern   Not on file  Social History Narrative   ** Merged History Encounter **       Social Determinants of Health   Financial Resource Strain: Not on file  Food Insecurity: Not on file  Transportation Needs: Not on file  Physical Activity: Not on file  Stress: Not on file  Social Connections: Not on file  Intimate Partner Violence: Not on file    No current outpatient medications on file.     ROS:  Review of Systems  Constitutional:  Negative for fatigue, fever and unexpected weight change.  Respiratory:  Negative for cough, shortness of breath and wheezing.   Cardiovascular:  Negative for chest pain, palpitations and leg swelling.  Gastrointestinal:  Negative for blood in stool, constipation, diarrhea, nausea and vomiting.  Endocrine: Negative for cold intolerance, heat intolerance and polyuria.  Genitourinary:  Positive for vaginal discharge. Negative for dyspareunia, dysuria, flank pain, frequency, genital sores, hematuria, menstrual problem, pelvic pain, urgency, vaginal bleeding and vaginal pain.  Musculoskeletal:  Negative for back pain, joint swelling and myalgias.  Skin:  Negative for rash.  Neurological:  Negative for dizziness, syncope, light-headedness, numbness and headaches.  Hematological:  Negative for adenopathy.  Psychiatric/Behavioral:  Negative for agitation, confusion, sleep disturbance and suicidal ideas. The patient is not nervous/anxious.   BREAST: No symptoms   Objective: There were no vitals taken  for this visit.   Physical Exam Constitutional:      Appearance: She is well-developed.  Genitourinary:     Vulva normal.     Right Labia: No rash, tenderness or lesions.    Left Labia: No tenderness, lesions or rash.    No vaginal discharge, erythema or tenderness.      Right Adnexa: not tender and no mass present.    Left Adnexa: not tender and no mass present.    No cervical friability or polyp.     Uterus is not enlarged or tender.  Breasts:    Right: No mass, nipple discharge, skin change or tenderness.     Left: No mass, nipple discharge, skin change or tenderness.  Neck:     Thyroid: No thyromegaly.  Cardiovascular:     Rate and Rhythm: Normal rate and regular rhythm.     Heart sounds: Normal heart sounds. No murmur heard. Pulmonary:     Effort: Pulmonary effort is normal.     Breath sounds: Normal breath sounds.  Abdominal:     Palpations: Abdomen is soft.     Tenderness: There is no abdominal tenderness. There is no guarding or rebound.  Musculoskeletal:        General: Normal range of motion.     Cervical back: Normal range of motion.  Lymphadenopathy:     Cervical: No cervical adenopathy.  Neurological:     General: No focal deficit present.     Mental Status: She is alert and oriented to person, place, and time.     Cranial Nerves: No cranial nerve deficit.  Skin:    General: Skin is warm and dry.  Psychiatric:        Mood and Affect: Mood normal.        Behavior: Behavior normal.        Thought Content: Thought content normal.        Judgment: Judgment normal.  Vitals reviewed.    No results found for this or any previous visit (from the past 24 hour(s)).    Assessment/Plan: Encounter for annual routine gynecological examination  Cervical cancer screening - Plan: Cytology - PAP  Screening for HPV (human papillomavirus) - Plan: Cytology - PAP  Encounter for screening mammogram for malignant neoplasm of breast - Plan: MM 3D SCREEN BREAST  BILATERAL; pt to sched mammo  Screening for colon cancer - Plan: Ambulatory referral to Gastroenterology; refer to GI for colonoscopy due to age  BV (bacterial vaginosis) - Plan: metroNIDAZOLE (FLAGYL) 500 MG tablet, POCT Wet Prep with KOH; pos sx and wet prep. Rx flagyl, no EtOH. Will RF if sx recur. F/u prn.   No orders of the defined types were placed in this encounter.          GYN counsel mammography screening, adequate intake of calcium and vitamin D, diet and exercise     F/U  No follow-ups  on file.  Cleda Imel B. Christophor Eick, PA-C 06/04/2023 4:51 PM

## 2023-06-05 ENCOUNTER — Ambulatory Visit: Payer: BC Managed Care – PPO | Admitting: Obstetrics and Gynecology

## 2023-06-05 DIAGNOSIS — Z1231 Encounter for screening mammogram for malignant neoplasm of breast: Secondary | ICD-10-CM

## 2023-06-05 DIAGNOSIS — Z01419 Encounter for gynecological examination (general) (routine) without abnormal findings: Secondary | ICD-10-CM

## 2024-07-04 ENCOUNTER — Ambulatory Visit: Admitting: Family Medicine

## 2024-07-04 ENCOUNTER — Encounter: Payer: Self-pay | Admitting: Family Medicine

## 2024-07-04 ENCOUNTER — Other Ambulatory Visit (HOSPITAL_COMMUNITY)
Admission: RE | Admit: 2024-07-04 | Discharge: 2024-07-04 | Disposition: A | Discharge status: Home / Self Care | Source: Ambulatory Visit | Attending: Family Medicine | Admitting: Family Medicine

## 2024-07-04 VITALS — BP 112/70 | HR 65 | Temp 98.4°F | Ht 60.75 in | Wt 136.5 lb

## 2024-07-04 DIAGNOSIS — R102 Pelvic and perineal pain: Secondary | ICD-10-CM | POA: Insufficient documentation

## 2024-07-04 DIAGNOSIS — Z114 Encounter for screening for human immunodeficiency virus [HIV]: Secondary | ICD-10-CM | POA: Diagnosis not present

## 2024-07-04 DIAGNOSIS — D509 Iron deficiency anemia, unspecified: Secondary | ICD-10-CM

## 2024-07-04 DIAGNOSIS — Z1159 Encounter for screening for other viral diseases: Secondary | ICD-10-CM | POA: Diagnosis not present

## 2024-07-04 DIAGNOSIS — L237 Allergic contact dermatitis due to plants, except food: Secondary | ICD-10-CM

## 2024-07-04 DIAGNOSIS — Z13220 Encounter for screening for lipoid disorders: Secondary | ICD-10-CM

## 2024-07-04 MED ORDER — TRIAMCINOLONE ACETONIDE 0.1 % EX CREA: 1.0000 | TOPICAL_CREAM | Freq: Two times a day (BID) | CUTANEOUS | 0 refills | Status: AC

## 2024-07-04 NOTE — Progress Notes (Signed)
 Established Patient Office Visit  Subjective   Patient ID: Alexis Odonnell, female    DOB: Jan 25, 1976  Age: 48 y.o. MRN: [id number]  Chief Complaint  Patient presents with   Pelvic Pain    Left-sided x3 months    Pt reports that for the last few months she has been having pelvic pain in the left lower quadrant . Hurts more when she is starting her cycle, hurts like a sharp pain then it goes away, is there for a few seconds and then it goes away. Still having regular periods every month. States her periods are lasting longer, up to 6 days, usually periods only last about 3 days. There is no fever or chills, no diarrhea or constipation, Bm's are regular, no excessive bleeding, no nausea or vomiting. Feels mostly like a cramp, not very severe. States she was told she had ovarian cysts. No unusual vaginal discharge, but does state that there is a little bit of smell, like a fishy smell.   Poison ivy-- pt reports she was hiking last week and got an outbreak on her right cheek and left forearm, states that it is extremely itchy, red and doesn't feel like it is getting any better. Would like a cream to put on it to help reduce itching.   I reviewed last set of labs with the patient today, she has a history of iron deficiency anemia and needs follow up blood work today, she reports mild fatigue but otherwise no heart palpitations or dizziness. Could be related to changes in her periods....   Pelvic Pain The patient's primary symptoms include pelvic pain.    Current Outpatient Medications  Medication Instructions   Multiple Vitamin (MULTIVITAMIN PO) Daily   Omega-3 Fatty Acids (OMEGA 3 PO) Daily   triamcinolone  cream (KENALOG ) 0.1 % 1 Application, Topical, 2 times daily    Patient Active Problem List   Diagnosis Date Noted   Iron deficiency anemia 07/07/2024   External hemorrhoid 12/05/2019      Review of Systems  Genitourinary:  Positive for pelvic pain.  All other systems  reviewed and are negative.     Objective:     BP 112/70   Pulse 65   Temp 98.4 F (36.9 C) (Oral)   Ht 5' 0.75 (1.543 m)   Wt 136 lb 8 oz (61.9 kg)   LMP 06/28/2024 (Exact Date)   SpO2 99%   BMI 26.00 kg/m    Physical Exam Vitals reviewed.  Constitutional:      Appearance: Normal appearance. She is well-groomed and normal weight.  Cardiovascular:     Rate and Rhythm: Normal rate and regular rhythm.     Heart sounds: S1 normal and S2 normal.  Pulmonary:     Effort: Pulmonary effort is normal.     Breath sounds: Normal breath sounds and air entry.  Abdominal:     General: Bowel sounds are normal.  Musculoskeletal:     Right lower leg: No edema.     Left lower leg: No edema.  Neurological:     Mental Status: She is alert and oriented to person, place, and time. Mental status is at baseline.     Gait: Gait is intact.  Psychiatric:        Mood and Affect: Mood and affect normal.        Speech: Speech normal.        Behavior: Behavior normal.        Judgment: Judgment normal.  No results found for any visits on 07/04/24.    The 10-year ASCVD risk score (Arnett DK, et al., 2019) is: 0.9%    Assessment & Plan:   Problem List Items Addressed This Visit       Unprioritized   Iron deficiency anemia - Primary   History of, reviewed last set of labs with the patient in office, she is taking iron supplements at home, will order new set of blood work for monitoring to determine if the IDA needs continued treatment with oral iron,.       Relevant Orders   CBC with Differential/Platelet   Iron, TIBC and Ferritin Panel   Other Visit Diagnoses       Need for hepatitis C screening test       Relevant Orders   Hep C Antibody     Encounter for screening for HIV       Relevant Orders   HIV antibody (with reflex)     Pelvic pain       Relevant Orders   Unclear etiology, her abdominal exam is benign, it could be related to her periods, will order cervico vag  swab today to rule out STI's and send her for transvaginal pelvic US , she may need referral to GYN depending on the resutls.   US  Pelvic Complete With Transvaginal   Cervicovaginal ancillary only     Lipid screening       Relevant Orders   Lipid panel     Poison ivy       Relevant Medications   Rash seen on exam today on the right forearm and right cheek, will rx triamcinolone  cream to speed recovery,  triamcinolone  cream (KENALOG ) 0.1 %       Return in about 1 year (around 07/04/2025) for annual physical exam.  I personally spent a total of 30 minutes in the care of the patient today including getting/reviewing separately obtained history, performing a medically appropriate exam/evaluation, counseling and educating, placing orders, independently interpreting results, and communicating results.   Heron CHRISTELLA Sharper, MD

## 2024-07-07 DIAGNOSIS — D509 Iron deficiency anemia, unspecified: Secondary | ICD-10-CM | POA: Insufficient documentation

## 2024-07-07 NOTE — Assessment & Plan Note (Signed)
 History of, reviewed last set of labs with the patient in office, she is taking iron supplements at home, will order new set of blood work for monitoring to determine if the IDA needs continued treatment with oral iron,.

## 2024-07-08 ENCOUNTER — Ambulatory Visit: Payer: Self-pay | Admitting: Family Medicine

## 2024-07-08 DIAGNOSIS — A749 Chlamydial infection, unspecified: Secondary | ICD-10-CM

## 2024-07-08 DIAGNOSIS — B9689 Other specified bacterial agents as the cause of diseases classified elsewhere: Secondary | ICD-10-CM

## 2024-07-08 LAB — CERVICOVAGINAL ANCILLARY ONLY
Bacterial Vaginitis (gardnerella): POSITIVE — AB
Candida Glabrata: NEGATIVE
Candida Vaginitis: NEGATIVE
Chlamydia: POSITIVE — AB
Comment: NEGATIVE
Comment: NEGATIVE
Comment: NEGATIVE
Comment: NEGATIVE
Comment: NEGATIVE
Comment: NORMAL
Neisseria Gonorrhea: NEGATIVE
Trichomonas: NEGATIVE

## 2024-07-08 MED ORDER — DOXYCYCLINE HYCLATE 100 MG PO TABS: 100.0000 mg | ORAL_TABLET | Freq: Two times a day (BID) | ORAL | 0 refills | Status: AC

## 2024-07-08 MED ORDER — METRONIDAZOLE 0.75 % VA GEL: 1.0000 | Freq: Every day | VAGINAL | 0 refills | Status: AC

## 2024-07-09 ENCOUNTER — Other Ambulatory Visit

## 2024-07-12 ENCOUNTER — Other Ambulatory Visit (INDEPENDENT_AMBULATORY_CARE_PROVIDER_SITE_OTHER)

## 2024-07-12 DIAGNOSIS — Z13220 Encounter for screening for lipoid disorders: Secondary | ICD-10-CM | POA: Diagnosis not present

## 2024-07-12 DIAGNOSIS — Z114 Encounter for screening for human immunodeficiency virus [HIV]: Secondary | ICD-10-CM

## 2024-07-12 DIAGNOSIS — Z1159 Encounter for screening for other viral diseases: Secondary | ICD-10-CM

## 2024-07-12 DIAGNOSIS — D509 Iron deficiency anemia, unspecified: Secondary | ICD-10-CM | POA: Diagnosis not present

## 2024-07-12 LAB — LIPID PANEL
Cholesterol: 186 mg/dL (ref 0–200)
HDL: 46.8 mg/dL (ref >39.00)
LDL Cholesterol: 114 mg/dL — ABNORMAL HIGH (ref 0–99)
NonHDL: 139.27
Total CHOL/HDL Ratio: 4
Triglycerides: 127 mg/dL (ref 0.0–149.0)
VLDL: 25.4 mg/dL (ref 0.0–40.0)

## 2024-07-12 LAB — CBC WITH DIFFERENTIAL/PLATELET
Basophils Absolute: 0 K/uL (ref 0.0–0.1)
Basophils Relative: 0.5 % (ref 0.0–3.0)
Eosinophils Absolute: 0.1 K/uL (ref 0.0–0.7)
Eosinophils Relative: 1.3 % (ref 0.0–5.0)
HCT: 27.5 % — ABNORMAL LOW (ref 36.0–46.0)
Hemoglobin: 8.4 g/dL — ABNORMAL LOW (ref 12.0–15.0)
Lymphocytes Relative: 34.7 % (ref 12.0–46.0)
Lymphs Abs: 2 K/uL (ref 0.7–4.0)
MCHC: 30.4 g/dL (ref 30.0–36.0)
MCV: 65.3 fl — ABNORMAL LOW (ref 78.0–100.0)
Monocytes Absolute: 0.3 K/uL (ref 0.1–1.0)
Monocytes Relative: 5.5 % (ref 3.0–12.0)
Neutro Abs: 3.3 K/uL (ref 1.4–7.7)
Neutrophils Relative %: 58 % (ref 43.0–77.0)
Platelets: 490 K/uL — ABNORMAL HIGH (ref 150.0–400.0)
RBC: 4.22 Mil/uL (ref 3.87–5.11)
RDW: 18.4 % — ABNORMAL HIGH (ref 11.5–15.5)
WBC: 5.7 K/uL (ref 4.0–10.5)

## 2024-07-13 LAB — HEPATITIS C ANTIBODY: Hepatitis C Ab: NONREACTIVE

## 2024-07-13 LAB — HIV ANTIBODY (ROUTINE TESTING W REFLEX): HIV 1&2 Ab, 4th Generation: NONREACTIVE

## 2024-07-13 LAB — IRON,TIBC AND FERRITIN PANEL
%SAT: 3 % — ABNORMAL LOW (ref 16–45)
Ferritin: 3 ng/mL — ABNORMAL LOW (ref 16–232)
Iron: 17 ug/dL — ABNORMAL LOW (ref 40–190)
TIBC: 517 ug/dL — ABNORMAL HIGH (ref 250–450)

## 2024-07-15 ENCOUNTER — Encounter: Payer: Self-pay | Admitting: Family Medicine

## 2024-07-15 ENCOUNTER — Ambulatory Visit (INDEPENDENT_AMBULATORY_CARE_PROVIDER_SITE_OTHER): Admitting: Family Medicine

## 2024-07-15 VITALS — BP 138/80 | HR 75 | Temp 98.3°F | Ht 60.75 in | Wt 137.7 lb

## 2024-07-15 DIAGNOSIS — R102 Pelvic and perineal pain: Secondary | ICD-10-CM

## 2024-07-15 DIAGNOSIS — A749 Chlamydial infection, unspecified: Secondary | ICD-10-CM | POA: Diagnosis not present

## 2024-07-15 DIAGNOSIS — D509 Iron deficiency anemia, unspecified: Secondary | ICD-10-CM

## 2024-07-15 NOTE — Patient Instructions (Addendum)
 Iron supplements should be twice a day for 30 days. Then can take once a day for the next 2 months.

## 2024-07-15 NOTE — Progress Notes (Signed)
   Established Patient Office Visit  Subjective   Patient ID: Alexis Odonnell, female    DOB: 1976-05-20  Age: 48 y.o. MRN: [id number]  Chief Complaint  Patient presents with   Medical Management of Chronic Issues    Pt is here to discuss her labs. She reports that she started taking the abx and the metrogel  for the infections, states that the pain in the left groin/ lower pelvis is still present. Does not hurt to walk on her hip, no constipation.   We also discussed her anemia and the iron supplements, I advised she take them twice a day for at least 1 month then reduce to once a day, we discussed the potential side effect of constipation and I advised she have something at home to help.     Current Outpatient Medications  Medication Instructions   Multiple Vitamin (MULTIVITAMIN PO) Daily   Omega-3 Fatty Acids (OMEGA 3 PO) Daily   triamcinolone  cream (KENALOG ) 0.1 % 1 Application, Topical, 2 times daily      Review of Systems  All other systems reviewed and are negative.     Objective:     BP 138/80   Pulse 75   Temp 98.3 F (36.8 C) (Oral)   Ht 5' 0.75 (1.543 m)   Wt 137 lb 11.2 oz (62.5 kg)   LMP 06/28/2024 (Exact Date)   SpO2 98%   BMI 26.23 kg/m    Physical Exam Vitals reviewed.  Constitutional:      Appearance: Normal appearance. She is normal weight.  Pulmonary:     Effort: Pulmonary effort is normal.  Abdominal:     General: Bowel sounds are normal.  Musculoskeletal:     Right lower leg: No edema.     Left lower leg: No edema.  Neurological:     Mental Status: She is alert and oriented to person, place, and time. Mental status is at baseline.      No results found for any visits on 07/15/24.    The 10-year ASCVD risk score (Arnett DK, et al., 2019) is: 1.5%    Assessment & Plan:  Pelvic pain   Persistent despite taking the doxycycline  for the chlamydia. She has not finished the abx yet, waiting for the pelvic US  to be done. I  counseled patient on the chlamydia infection, will need to retest in 3 months.   Chlamydia  Iron deficiency anemia, unspecified iron deficiency anemia type Assessment & Plan: Labs reviewed with patient recommended 325 mg BID ferrous sulfate, will waiting on the pelvic US  to be done. We did discussed referring her to GYN once the US  is complete.   I spent 20 minutes with the patient today reviewing her labs, counseling her on the pelvic pain and chlamydia infection, also gave her verbal and written instructions on the iron supplements    Return in about 3 months (around 10/15/2024) for recheck labs , annual physical exam.    Heron CHRISTELLA Sharper, MD

## 2024-07-22 NOTE — Assessment & Plan Note (Signed)
 Labs reviewed with patient recommended 325 mg BID ferrous sulfate, will waiting on the pelvic US  to be done. We did discussed referring her to GYN once the US  is complete.

## 2024-10-18 ENCOUNTER — Ambulatory Visit: Admitting: Family Medicine

## 2024-11-25 NOTE — Progress Notes (Deleted)
 "  Subjective:    Patient ID: Alexis Odonnell, female    DOB: Apr 03, 1976, 48 y.o.   MRN: [id number]  HPI  Patient presents to the clinic today to establish care and for management of the conditions listed below.  Iron deficiency anemia: Her last H/H was 8.4/27.5, 06/2024.  HLD: Her last LDL was 114, triglycerides 127, 06/2024.  She is taking fish oil OTC.  She tries to consume a low-fat diet.  Review of Systems   Past Medical History:  Diagnosis Date   BV (bacterial vaginosis)    Hypercholesteremia    Ovarian cyst     Current Outpatient Medications  Medication Sig Dispense Refill   Multiple Vitamin (MULTIVITAMIN PO) Take by mouth daily.     Omega-3 Fatty Acids (OMEGA 3 PO) Take by mouth daily.     triamcinolone  cream (KENALOG ) 0.1 % Apply 1 Application topically 2 (two) times daily. 30 g 0   No current facility-administered medications for this visit.    Allergies[1]  Family History  Problem Relation Age of Onset   Hypertension Mother    Diabetes Father    Hyperlipidemia Brother    Breast cancer Neg Hx    Ovarian cancer Neg Hx     Social History   Socioeconomic History   Marital status: Married    Spouse name: Not on file   Number of children: Not on file   Years of education: Not on file   Highest education level: Not on file  Occupational History   Not on file  Tobacco Use   Smoking status: Never   Smokeless tobacco: Never  Vaping Use   Vaping status: Never Used  Substance and Sexual Activity   Alcohol use: Never   Drug use: Never   Sexual activity: Yes    Birth control/protection: Surgical    Comment: Tubal Ligation  Other Topics Concern   Not on file  Social History Narrative   ** Merged History Encounter **       Social Drivers of Health   Tobacco Use: Low Risk (07/15/2024)   Patient History    Smoking Tobacco Use: Never    Smokeless Tobacco Use: Never    Passive Exposure: Not on file  Financial Resource Strain: Not on file  Food  Insecurity: Not on file  Transportation Needs: Not on file  Physical Activity: Not on file  Stress: Not on file  Social Connections: Not on file  Intimate Partner Violence: Not on file  Depression (PHQ2-9): Low Risk (07/04/2024)   Depression (PHQ2-9)    PHQ-2 Score: 1  Alcohol Screen: Not on file  Housing: Not on file  Utilities: Not on file  Health Literacy: Not on file     Constitutional: Denies fever, malaise, fatigue, headache or abrupt weight changes.  HEENT: Denies eye pain, eye redness, ear pain, ringing in the ears, wax buildup, runny nose, nasal congestion, bloody nose, or sore throat. Respiratory: Denies difficulty breathing, shortness of breath, cough or sputum production.   Cardiovascular: Denies chest pain, chest tightness, palpitations or swelling in the hands or feet.  Gastrointestinal: Denies abdominal pain, bloating, constipation, diarrhea or blood in the stool.  GU: Denies urgency, frequency, pain with urination, burning sensation, blood in urine, odor or discharge. Musculoskeletal: Denies decrease in range of motion, difficulty with gait, muscle pain or joint pain and swelling.  Skin: Denies redness, rashes, lesions or ulcercations.  Neurological: Denies dizziness, difficulty with memory, difficulty with speech or problems with balance and coordination.  Psych: Denies anxiety, depression, SI/HI.  No other specific complaints in a complete review of systems (except as listed in HPI above).      Objective:   Physical Exam  There were no vitals taken for this visit. Wt Readings from Last 3 Encounters:  07/15/24 137 lb 11.2 oz (62.5 kg)  07/04/24 136 lb 8 oz (61.9 kg)  03/07/23 135 lb 12.8 oz (61.6 kg)    General: Appears their stated age, well developed, well nourished in NAD. Skin: Warm, dry and intact. No rashes, lesions or ulcerations noted. HEENT: Head: normal shape and size; Eyes: sclera white, no icterus, conjunctiva pink, PERRLA and EOMs intact; Ears:  Tm's gray and intact, normal light reflex; Nose: mucosa pink and moist, septum midline; Throat/Mouth: Teeth present, mucosa pink and moist, no exudate, lesions or ulcerations noted.  Neck:  Neck supple, trachea midline. No masses, lumps or thyromegaly present.  Cardiovascular: Normal rate and rhythm. S1,S2 noted.  No murmur, rubs or gallops noted. No JVD or BLE edema. No carotid bruits noted. Pulmonary/Chest: Normal effort and positive vesicular breath sounds. No respiratory distress. No wheezes, rales or ronchi noted.  Abdomen: Soft and nontender. Normal bowel sounds. No distention or masses noted. Liver, spleen and kidneys non palpable. Musculoskeletal: Normal range of motion. No signs of joint swelling. No difficulty with gait.  Neurological: Alert and oriented. Cranial nerves II-XII grossly intact. Coordination normal.  Psychiatric: Mood and affect normal. Behavior is normal. Judgment and thought content normal.    BMET    Component Value Date/Time   NA 136 03/09/2023 0858   K 3.9 03/09/2023 0858   CL 105 03/09/2023 0858   CO2 25 03/09/2023 0858   GLUCOSE 97 03/09/2023 0858   BUN 20 03/09/2023 0858   CREATININE 0.74 03/09/2023 0858   CALCIUM 9.0 03/09/2023 0858    Lipid Panel     Component Value Date/Time   CHOL 186 07/12/2024 0953   TRIG 127.0 07/12/2024 0953   HDL 46.80 07/12/2024 0953   CHOLHDL 4 07/12/2024 0953   VLDL 25.4 07/12/2024 0953   LDLCALC 114 (H) 07/12/2024 0953    CBC    Component Value Date/Time   WBC 5.7 07/12/2024 0953   RBC 4.22 07/12/2024 0953   HGB 8.4 Repeated and verified X2. (L) 07/12/2024 0953   HGB 9.3 (L) 10/02/2012 1219   HCT 27.5 (L) 07/12/2024 0953   HCT 24.4 (L) 10/03/2012 0644   PLT 490.0 (H) 07/12/2024 0953   PLT 293 10/02/2012 1219   MCV 65.3 Repeated and verified X2. (L) 07/12/2024 0953   MCV 69 (L) 10/02/2012 1219   MCH 21.7 (L) 10/02/2012 1219   MCHC 30.4 07/12/2024 0953   RDW 18.4 (H) 07/12/2024 0953   RDW 18.1 (H) 10/02/2012  1219   LYMPHSABS 2.0 07/12/2024 0953   LYMPHSABS 1.9 10/02/2012 1219   MONOABS 0.3 07/12/2024 0953   MONOABS 0.3 10/02/2012 1219   EOSABS 0.1 07/12/2024 0953   EOSABS 0.0 10/02/2012 1219   BASOSABS 0.0 07/12/2024 0953   BASOSABS 0.0 10/02/2012 1219    Hgb A1C No results found for: HGBA1C          Assessment & Plan:   RTC in 6 months for annual exam Angeline Laura, NP     [1] No Known Allergies  "

## 2024-11-26 ENCOUNTER — Encounter: Admitting: Internal Medicine
